# Patient Record
Sex: Female | Born: 1995 | Race: White | Hispanic: No | Marital: Single | Smoking: Current every day smoker
Health system: Southern US, Community
[De-identification: ages and names within clinical notes are randomized; demographics above are authoritative.]

## PROBLEM LIST (undated history)

## (undated) DIAGNOSIS — F1110 Opioid abuse, uncomplicated: Secondary | ICD-10-CM

## (undated) DIAGNOSIS — F1910 Other psychoactive substance abuse, uncomplicated: Secondary | ICD-10-CM

## (undated) DIAGNOSIS — K759 Inflammatory liver disease, unspecified: Secondary | ICD-10-CM

## (undated) DIAGNOSIS — D649 Anemia, unspecified: Secondary | ICD-10-CM

## (undated) DIAGNOSIS — R011 Cardiac murmur, unspecified: Secondary | ICD-10-CM

## (undated) HISTORY — DX: Inflammatory liver disease, unspecified: K75.9

## (undated) HISTORY — DX: Cardiac murmur, unspecified: R01.1

## (undated) HISTORY — DX: Anemia, unspecified: D64.9

## (undated) HISTORY — PX: NO PAST SURGERIES: SHX2092

## (undated) HISTORY — DX: Opioid abuse, uncomplicated: F11.10

---
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
## 2017-05-06 ENCOUNTER — Emergency Department
Admission: EM | Admit: 2017-05-06 | Discharge: 2017-05-06 | Disposition: A | Discharge status: Home / Self Care | Payer: 59 | Attending: Emergency Medicine | Admitting: Emergency Medicine

## 2017-05-06 DIAGNOSIS — F17210 Nicotine dependence, cigarettes, uncomplicated: Secondary | ICD-10-CM | POA: Insufficient documentation

## 2017-05-06 DIAGNOSIS — Z79899 Other long term (current) drug therapy: Secondary | ICD-10-CM | POA: Insufficient documentation

## 2017-05-06 DIAGNOSIS — F1510 Other stimulant abuse, uncomplicated: Secondary | ICD-10-CM

## 2017-05-06 DIAGNOSIS — F1120 Opioid dependence, uncomplicated: Secondary | ICD-10-CM | POA: Insufficient documentation

## 2017-05-06 DIAGNOSIS — F1123 Opioid dependence with withdrawal: Secondary | ICD-10-CM

## 2017-05-06 DIAGNOSIS — R451 Restlessness and agitation: Secondary | ICD-10-CM | POA: Diagnosis present

## 2017-05-06 DIAGNOSIS — F1410 Cocaine abuse, uncomplicated: Secondary | ICD-10-CM

## 2017-05-06 HISTORY — DX: Other psychoactive substance abuse, uncomplicated: F19.10

## 2017-05-06 LAB — CBC
HCT: 38.7 % (ref 35.0–47.0)
HEMOGLOBIN: 13.2 g/dL (ref 12.0–16.0)
MCH: 28 pg (ref 26.0–34.0)
MCHC: 34.1 g/dL (ref 32.0–36.0)
MCV: 82.2 fL (ref 80.0–100.0)
Platelets: 353 10*3/uL (ref 150–440)
RBC: 4.7 MIL/uL (ref 3.80–5.20)
RDW: 14.7 % — ABNORMAL HIGH (ref 11.5–14.5)
WBC: 6.9 10*3/uL (ref 3.6–11.0)

## 2017-05-06 LAB — COMPREHENSIVE METABOLIC PANEL
ALK PHOS: 64 U/L (ref 38–126)
ALT: 16 U/L (ref 14–54)
AST: 19 U/L (ref 15–41)
Albumin: 4.1 g/dL (ref 3.5–5.0)
Anion gap: 8 (ref 5–15)
BUN: 11 mg/dL (ref 6–20)
CALCIUM: 9.1 mg/dL (ref 8.9–10.3)
CO2: 25 mmol/L (ref 22–32)
CREATININE: 0.65 mg/dL (ref 0.44–1.00)
Chloride: 106 mmol/L (ref 101–111)
Glucose, Bld: 121 mg/dL — ABNORMAL HIGH (ref 65–99)
Potassium: 3.9 mmol/L (ref 3.5–5.1)
Sodium: 139 mmol/L (ref 135–145)
Total Bilirubin: 0.5 mg/dL (ref 0.3–1.2)
Total Protein: 7 g/dL (ref 6.5–8.1)

## 2017-05-06 LAB — ACETAMINOPHEN LEVEL: Acetaminophen (Tylenol), Serum: <10 ug/mL — ABNORMAL LOW (ref 10–30)

## 2017-05-06 LAB — SALICYLATE LEVEL: Salicylate Lvl: <7 mg/dL (ref 2.8–30.0)

## 2017-05-06 LAB — ETHANOL

## 2017-05-06 NOTE — ED Notes (Signed)
Pt. States mother will pick her up in waiting room.

## 2017-05-06 NOTE — ED Notes (Signed)
Mother would like a call from the psychiatrist when he consults with this pt so he may receive collateral information Kerry DoryKatanya Jones - mother  [phone]

## 2017-05-06 NOTE — ED Notes (Signed)
BEHAVIORAL HEALTH ROUNDING Patient sleeping: Yes.   Patient alert and oriented: eyes closed  Appears to be asleep Behavior appropriate: Yes.  ; If no, describe:  Nutrition and fluids offered: Yes  Toileting and hygiene offered: sleeping Sitter present: q 15 minute observations and security monitoring Law enforcement present: yes  ODS 

## 2017-05-06 NOTE — ED Notes (Signed)

## 2017-05-06 NOTE — ED Notes (Signed)
Pt up using the restroom with no assistance

## 2017-05-06 NOTE — ED Notes (Signed)
Dr Renaldo Fiddlerlapecs at bedside.

## 2017-05-06 NOTE — Consult Note (Signed)
[facility] Consult   Reason for Consult:  Consult for 21 year old woman brought to the hospital voluntarily by her family because of concerns about drug abuse Referring Physician:  Eual Fines Patient Identification: ANHTHU PERDEW MRN:  [id number] Principal Diagnosis: Opiate dependence ([place]) Diagnosis:   Patient Active Problem List   Diagnosis Date Noted  . Opiate dependence ([place]) [F11.20] 05/06/2017  . 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 Cocaine abuse [F14.10] 05/06/2017    Total Time spent with patient: 1 hour  Subjective:   Leslie Klein is a 21 y.o. female patient admitted with "my mom brought me here. I'm having withdrawal".  HPI:  21 year old woman brought here voluntarily by her family. Patient states she is currently having symptoms of heroin withdrawal. She is complaining of being sick to her stomach, having aches and pains all over her body, sweating, feeling jittery. She last had any heroin yesterday midday. Patient was locked up in jail for what she describes as a few hours before her mother bailed her out. On getting home she and her mother had an argument and her mother took her phone away from her. Patient admits that she made a threat to kill herself because her mother took her phone away from her but says very clearly that this was only to get the phone back. She denies any intention plan or wish to kill her self. Patient says other than these particular events her mood has been fine. She admits that she sleeps poorly and has been eating poorly and losing significant weight. She says she is using heroin pretty much every day and also using methamphetamine and crack cocaine frequently. She is not engaging in any kind of mental health or substance abuse treatment. Patient denies hallucinations or psychotic symptoms. Denies any thoughts to harm anyone else. Not currently on any prescription medicine.  Social history: She reports that she is married but her husband  is locked up in jail. He is also a drug abuser. For now she is staying with her mother. Patient just got out after being locked up for a brief period of time.  Medical history: Patient describes symptoms characteristic of opiate withdrawal. Otherwise has no known diagnosed medical issues.  Substance abuse history: She says she's been abusing drugs for a couple years now. She has been to [facility] one time for about a week but did not stay clean at all after going in there. Has never really engaged in outpatient substance abuse treatment. Has never gone all the way through narcotic withdrawal to get clean before. Denies ever having overdosed from opiates. Drinking alcohol.  Past Psychiatric History: As noted above she is been to [facility] for drug abuse but denies ever seeing a psychiatrist for any other kind of mental health condition. No history of suicide attempts reported. No history of violence or psychosis. Denies ever being on any psychiatric medicine.  Risk to Self: Is patient at risk for suicide?: No Risk to Others:   Prior Inpatient Therapy:   Prior Outpatient Therapy:    Past Medical History:  Past Medical History:  Diagnosis Date  . Substance abuse    No past surgical history on file. Family History: No family history on file. Family Psychiatric  History: Does not know of any Social History:  History  Alcohol Use No     History  Drug Use    Comment: past per pt    Social History   Social History  . Marital status: Single  Spouse name: N/A  . Number of children: N/A  . Years of education: N/A   Social History Main Topics  . Smoking status: Current Every Day Smoker    Packs/day: 0.50    Types: Cigarettes  . Smokeless tobacco: Never Used  . Alcohol use No  . Drug use: Yes     Comment: past per pt  . Sexual activity: Not on file   Other Topics Concern  . Not on file   Social History Narrative  . No narrative on file   Additional Social History:      Allergies:   Allergies  Allergen Reactions  . Amoxicillin Rash    Labs:  Results for orders placed or performed during the hospital encounter of 05/06/17 (from the past 48 hour(s))  Comprehensive metabolic panel     Status: Abnormal   Collection Time: 05/06/17  3:47 PM  Result Value Ref Range   Sodium 139 135 - 145 mmol/L   Potassium 3.9 3.5 - 5.1 mmol/L   Chloride 106 101 - 111 mmol/L   CO2 25 22 - 32 mmol/L   Glucose, Bld 121 (H) 65 - 99 mg/dL   BUN 11 6 - 20 mg/dL   Creatinine, Ser 0.65 0.44 - 1.00 mg/dL   Calcium 9.1 8.9 - 10.3 mg/dL   Total Protein 7.0 6.5 - 8.1 g/dL   Albumin 4.1 3.5 - 5.0 g/dL   AST 19 15 - 41 U/L   ALT 16 14 - 54 U/L   Alkaline Phosphatase 64 38 - 126 U/L   Total Bilirubin 0.5 0.3 - 1.2 mg/dL   GFR calc non Af Amer >60 >60 mL/min   GFR calc Af Amer >60 >60 mL/min    Comment: (NOTE) The eGFR has been calculated using the CKD EPI equation. This calculation has not been validated in all clinical situations. eGFR's persistently <60 mL/min signify possible Chronic Kidney Disease.    Anion gap 8 5 - 15  Ethanol     Status: None   Collection Time: 05/06/17  3:47 PM  Result Value Ref Range   Alcohol, Ethyl (B) <5 <5 mg/dL    Comment:        LOWEST DETECTABLE LIMIT FOR SERUM ALCOHOL IS 5 mg/dL FOR MEDICAL PURPOSES ONLY   Salicylate level     Status: None   Collection Time: 05/06/17  3:47 PM  Result Value Ref Range   Salicylate Lvl <3.4 2.8 - 30.0 mg/dL  Acetaminophen level     Status: Abnormal   Collection Time: 05/06/17  3:47 PM  Result Value Ref Range   Acetaminophen (Tylenol), Serum <10 (L) 10 - 30 ug/mL    Comment:        THERAPEUTIC CONCENTRATIONS VARY SIGNIFICANTLY. A RANGE OF 10-30 ug/mL MAY BE AN EFFECTIVE CONCENTRATION FOR MANY PATIENTS. HOWEVER, SOME ARE BEST TREATED AT CONCENTRATIONS OUTSIDE THIS RANGE. ACETAMINOPHEN CONCENTRATIONS >150 ug/mL AT 4 HOURS AFTER INGESTION AND >50 ug/mL AT 12 HOURS AFTER INGESTION ARE OFTEN  ASSOCIATED WITH TOXIC REACTIONS.   cbc     Status: Abnormal   Collection Time: 05/06/17  3:47 PM  Result Value Ref Range   WBC 6.9 3.6 - 11.0 K/uL   RBC 4.70 3.80 - 5.20 MIL/uL   Hemoglobin 13.2 12.0 - 16.0 g/dL   HCT 38.7 35.0 - 47.0 %   MCV 82.2 80.0 - 100.0 fL   MCH 28.0 26.0 - 34.0 pg   MCHC 34.1 32.0 - 36.0 g/dL   RDW 14.7 (H)  11.5 - 14.5 %   Platelets 353 150 - 440 K/uL    No current facility-administered medications for this encounter.    Current Outpatient Prescriptions  Medication Sig Dispense Refill  . acetaminophen (TYLENOL) 500 MG tablet Take 500 mg by mouth every 6 (six) hours as needed for mild pain or headache.    . cephALEXin (KEFLEX) 500 MG capsule Take 1 capsule (500 mg total) by mouth 4 (four) times daily. 28 capsule 0  . HYDROcodone-acetaminophen (NORCO/VICODIN) 5-325 MG tablet Take 1 tablet every 6 hours for pain not helped by Tylenol alone 6 tablet 0  . ibuprofen (ADVIL,MOTRIN) 200 MG tablet Take 200 mg by mouth every 6 (six) hours as needed for moderate pain.    Marland Kitchen ondansetron (ZOFRAN ODT) 8 MG disintegrating tablet Take 1 tablet (8 mg total) by mouth every 8 (eight) hours as needed for vomiting. 8 tablet 0  . ondansetron (ZOFRAN) 4 MG tablet Take 1 tablet (4 mg total) by mouth every 6 (six) hours. 12 tablet 0  . oxymetazoline (AFRIN) 0.05 % nasal spray Place 1 spray into both nostrils 2 (two) times daily.    . sodium chloride (OCEAN) 0.65 % SOLN nasal spray Place 1 spray into both nostrils as needed for congestion.      Musculoskeletal: Strength & Muscle Tone: decreased Gait & Station: normal Patient leans: N/A  Psychiatric Specialty Exam: Physical Exam  Constitutional: She appears well-developed. She appears cachectic.  HENT:  Head: Normocephalic and atraumatic.  Eyes: Pupils are equal, round, and reactive to light. Conjunctivae are normal.  Neck: Normal range of motion.  Cardiovascular: Normal heart sounds.   Respiratory: Effort normal.  GI: Soft.   Musculoskeletal: Normal range of motion.  Neurological: She is alert.  Skin: Skin is warm. She is diaphoretic.     Psychiatric: Her affect is blunt. Her speech is delayed. She is slowed. Thought content is not paranoid and not delusional. Cognition and memory are normal. She expresses impulsivity. She expresses no homicidal and no suicidal ideation.    Review of Systems  Constitutional: Positive for diaphoresis, malaise/fatigue and weight loss.  HENT: Negative.   Eyes: Negative.   Respiratory: Negative.   Cardiovascular: Negative.   Gastrointestinal: Positive for nausea.  Musculoskeletal: Positive for myalgias.  Skin: Negative.   Neurological: Negative.   Psychiatric/Behavioral: Positive for substance abuse. Negative for depression, hallucinations, memory loss and suicidal ideas. The patient has insomnia. The patient is not nervous/anxious.     Blood pressure (!) 104/91, pulse 72, temperature 98.1 F (36.7 C), temperature source Oral, resp. rate 18, height 5' 3"  (1.6 m), weight 45.4 kg (100 lb), SpO2 98 %.Body mass index is 17.71 kg/m.  General Appearance: Disheveled  Eye Contact:  Fair  Speech:  Slow  Volume:  Decreased  Mood:  Dysphoric  Affect:  Constricted  Thought Process:  Goal Directed  Orientation:  Full (Time, Place, and Person)  Thought Content:  Logical  Suicidal Thoughts:  No  Homicidal Thoughts:  No  Memory:  Immediate;   Good Recent;   Fair Remote;   Fair  Judgement:  Fair  Insight:  Fair  Psychomotor Activity:  Decreased  Concentration:  Concentration: Fair  Recall:  [employer] of Knowledge:  Fair  Language:  Fair  Akathisia:  No  Handed:  Right  AIMS (if indicated):     Assets:  Social Support  ADL's:  Intact  Cognition:  Impaired,  Mild  Sleep:        Treatment  Plan Summary: Plan This is a 21 year old woman with heroin addiction also abusing other drugs. Clearly underweight and covered with sores on her skin.Marland Kitchen Poor self-care. However the  patient completely denies any suicidal or homicidal thought or intent. No evidence that she was actually intending to harm or kill herself. Denies any past suicide attempts. Patient accurately describes narcotic withdrawal. She does not meet commitment criteria for any other mental health condition. I reviewed with the patient the potential options. If she still had appropriate insurance we could look into whether [facility] was available. We could also look into whether residential treatment services was available. Patient declines both of these offers saying that she really does not want to stop using right now. Her reason is because she does not want to go through all of the withdrawal. At this point patient does not meet criteria for admission to the psychiatric hospital. I spoke with her at some length about the dangers of drug abuse including the multiple obvious fatal outcomes that can occur from abusing drugs the way she is doing so. Strongly encouraged her to get involved in withdrawal and sobriety. Case reviewed with emergency room physician and TTS. Patient can be released from the emergency room at the discretion of the emergency room doctor.  Disposition: Patient does not meet criteria for psychiatric inpatient admission. Supportive therapy provided about ongoing stressors.  Alethia Berthold, MD 05/06/2017 7:25 PM

## 2017-05-06 NOTE — ED Provider Notes (Signed)
[facility] Emergency Department Provider Note  ____________________________________________  Time seen: Approximately 4:23 PM  I have reviewed the triage vital signs and the nursing notes.   HISTORY  Chief Complaint Withdrawal and Agitation   HPI Leslie Klein is a 21 y.o. female history of heroin abuse [place]. Patient was released from jail yesterday. Today she was using heroin and told her mother. The both of them got into an argument and her mother took her phone. Patient told her mother that she was going to hurt herself in order to get her phone back. Patient denies any suicidality at this time. She denies any prior suicide attempts. She denies homicidal ideation. She declined any help with her substance abuse. She has no medical complaints at this time. She has been using heroin several years on a regular basis.  Past Medical History:  Diagnosis Date  . Substance abuse     There are no active problems to display for this patient.   No past surgical history on file.  Prior to Admission medications   Medication Sig Start Date End Date Taking? Authorizing Provider  acetaminophen (TYLENOL) 500 MG tablet Take 500 mg by mouth every 6 (six) hours as needed for mild pain or headache.    [provider]  cephALEXin (KEFLEX) 500 MG capsule Take 1 capsule (500 mg total) by mouth 4 (four) times daily. 09/23/16   Bethann BerkshireZammit, Joseph, MD  HYDROcodone-acetaminophen (NORCO/VICODIN) 5-325 MG tablet Take 1 tablet every 6 hours for pain not helped by Tylenol alone 09/23/16   Bethann BerkshireZammit, Joseph, MD  ibuprofen (ADVIL,MOTRIN) 200 MG tablet Take 200 mg by mouth every 6 (six) hours as needed for moderate pain.    [provider]  ondansetron (ZOFRAN ODT) 8 MG disintegrating tablet Take 1 tablet (8 mg total) by mouth every 8 (eight) hours as needed for vomiting. 09/29/15   Zadie RhineWickline, Donald, MD    ondansetron (ZOFRAN) 4 MG tablet Take 1 tablet (4 mg total) by mouth every 6 (six) hours. 09/23/16   Bethann BerkshireZammit, Joseph, MD  oxymetazoline (AFRIN) 0.05 % nasal spray Place 1 spray into both nostrils 2 (two) times daily.    [provider]  sodium chloride (OCEAN) 0.65 % SOLN nasal spray Place 1 spray into both nostrils as needed for congestion.    [provider]    Allergies Amoxicillin  No family history on file.  Social History Social History  Substance Use Topics  . Smoking status: Current Every Day Smoker    Packs/day: 0.50    Types: Cigarettes  . Smokeless tobacco: Never Used  . Alcohol use No    Review of Systems  Constitutional: Negative for fever. Eyes: Negative for visual changes. ENT: Negative for sore throat. Neck: No neck pain  Cardiovascular: Negative for chest pain. Respiratory: Negative for shortness of breath. Gastrointestinal: Negative for abdominal pain, vomiting or diarrhea. Genitourinary: Negative for dysuria. Musculoskeletal: Negative for back pain. Skin: Negative for rash. Neurological: Negative for headaches, weakness or numbness. Psych: No SI or HI  ____________________________________________   PHYSICAL EXAM:  VITAL SIGNS: ED Triage Vitals  Enc Vitals Group     BP 05/06/17 1546 (!) 104/91     Pulse Rate 05/06/17 1546 72     Resp 05/06/17 1546 18     Temp 05/06/17 1546 98.1 F (36.7 C)     Temp Source 05/06/17 1546 Oral     SpO2 05/06/17  1546 98 %     Weight 05/06/17 1546 100 lb (45.4 kg)     Height 05/06/17 1546  (1.6 m)     Head Circumference --      Peak Flow --      Pain Score 05/06/17 1545 7     Pain Loc --      Pain Edu? --      Excl. in GC? --     Constitutional: Alert and oriented. Well appearing and in no apparent distress. HEENT:      Head: Normocephalic and atraumatic.         Eyes: Conjunctivae are normal. Sclera is non-icteric.       Mouth/Throat: Mucous membranes are moist.       Neck: Supple  with no signs of meningismus. Cardiovascular: Regular rate and rhythm. No murmurs, gallops, or rubs. 2+ symmetrical distal pulses are present in all extremities. No JVD. Respiratory: Normal respiratory effort. Lungs are clear to auscultation bilaterally. No wheezes, crackles, or rhonchi.  Gastrointestinal: Soft, non tender, and non distended with positive bowel sounds. No rebound or guarding. Genitourinary: No CVA tenderness. Musculoskeletal: Nontender with normal range of motion in all extremities. No edema, cyanosis, or erythema of extremities. Neurologic: Normal speech and language. Face is symmetric. Moving all extremities. No gross focal neurologic deficits are appreciated. Skin: Skin is warm, dry and intact. No rash noted. Psychiatric: Mood and affect are normal. Speech and behavior are normal.  ____________________________________________   LABS (all labs ordered are listed, but only abnormal results are displayed)  Labs Reviewed  COMPREHENSIVE METABOLIC PANEL - Abnormal; Notable for the following:       Result Value   Glucose, Bld 121 (*)    All other components within normal limits  ACETAMINOPHEN LEVEL - Abnormal; Notable for the following:    Acetaminophen (Tylenol), Serum <10 (*)    All other components within normal limits  CBC - Abnormal; Notable for the following:    RDW 14.7 (*)    All other components within normal limits  ETHANOL  SALICYLATE LEVEL  URINE DRUG SCREEN, QUALITATIVE ([facility] ONLY)   ____________________________________________  EKG  none  ____________________________________________  RADIOLOGY  none  ____________________________________________   PROCEDURES  Procedure(s) performed: None Procedures Critical Care performed:  None ____________________________________________   INITIAL IMPRESSION / ASSESSMENT AND PLAN / ED COURSE  21 y.o. female history of heroin abuse who is brought in by [employer] having an argument with her  mother. Patient not IVC. She denies SI or HI. Patient here voluntarily. Does not wish help with substance abuse. Labs and urine/Upreg pending for medical clearance. Will consullt psych    _________________________ 7:15 PM on 05/06/2017 -----------------------------------------  Patient seen by psychiatry and clear to be discharged.  Pertinent labs & imaging results that were available during my care of the patient were reviewed by me and considered in my medical decision making (see chart for details).    ____________________________________________   FINAL CLINICAL IMPRESSION(S) / ED DIAGNOSES  Final diagnoses:  Heroin addiction (HCC)      NEW MEDICATIONS STARTED DURING THIS VISIT:  New Prescriptions   No medications on file     Note:  This document was prepared using Dragon voice recognition software and may include unintentional dictation errors.    Nita Sickle, MD 05/06/17 [id number]

## 2017-05-06 NOTE — ED Triage Notes (Signed)
[facility]  Report to me was that she was released from jail this am and at some point used heroin - she then went to her mothers and they got into an altercation r/t the mother not giving her her phone back  Pt denies SI/HI during triage  She denies wanting any help with her substance abuse  Reports that she uses heroin, meth and cocaine

## 2017-09-28 ENCOUNTER — Encounter (HOSPITAL_COMMUNITY): Payer: Self-pay | Admitting: Emergency Medicine

## 2017-09-28 ENCOUNTER — Emergency Department (HOSPITAL_COMMUNITY)
Admission: EM | Admit: 2017-09-28 | Discharge: 2017-09-28 | Disposition: A | Discharge status: Home / Self Care | Payer: Medicaid Other | Attending: Emergency Medicine | Admitting: Emergency Medicine

## 2017-09-28 DIAGNOSIS — R1110 Vomiting, unspecified: Secondary | ICD-10-CM | POA: Diagnosis not present

## 2017-09-28 DIAGNOSIS — R51 Headache: Secondary | ICD-10-CM | POA: Insufficient documentation

## 2017-09-28 DIAGNOSIS — R509 Fever, unspecified: Secondary | ICD-10-CM | POA: Insufficient documentation

## 2017-09-28 DIAGNOSIS — F1910 Other psychoactive substance abuse, uncomplicated: Secondary | ICD-10-CM | POA: Diagnosis not present

## 2017-09-28 DIAGNOSIS — R42 Dizziness and giddiness: Secondary | ICD-10-CM | POA: Insufficient documentation

## 2017-09-28 LAB — RAPID URINE DRUG SCREEN, HOSP PERFORMED
AMPHETAMINES: POSITIVE — AB
Barbiturates: NOT DETECTED
Benzodiazepines: POSITIVE — AB
Cocaine: NOT DETECTED
OPIATES: POSITIVE — AB
Tetrahydrocannabinol: NOT DETECTED

## 2017-09-28 LAB — URINALYSIS, ROUTINE W REFLEX MICROSCOPIC
BILIRUBIN URINE: NEGATIVE
GLUCOSE, UA: NEGATIVE mg/dL
HGB URINE DIPSTICK: NEGATIVE
KETONES UR: NEGATIVE mg/dL
Leukocytes, UA: NEGATIVE
Nitrite: NEGATIVE
PROTEIN: NEGATIVE mg/dL
Specific Gravity, Urine: 1.021 (ref 1.005–1.030)
pH: 5 (ref 5.0–8.0)

## 2017-09-28 LAB — INFLUENZA PANEL BY PCR (TYPE A & B)
INFLAPCR: NEGATIVE
INFLBPCR: NEGATIVE

## 2017-09-28 MED ORDER — SODIUM CHLORIDE 0.9 % IV SOLN
1000.0000 mL | INTRAVENOUS | Status: DC
  Administered 2017-09-28: 1000 mL via INTRAVENOUS

## 2017-09-28 MED ORDER — SODIUM CHLORIDE 0.9 % IV BOLUS (SEPSIS)
1000.0000 mL | Freq: Once | INTRAVENOUS | Status: AC
  Administered 2017-09-28: 1000 mL via INTRAVENOUS

## 2017-09-28 NOTE — Discharge Instructions (Signed)
Your vital signs have improved since being in the emergency department and receiving IV fluids.  Please discuss your symptoms with your Suboxone provider.  Please use your Suboxone as prescribed.  Your influenza test is negative.  Your urine analysis is negative for infection.  Please return to the emergency department immediately if any emergent changes, problems, or concerns.

## 2017-09-28 NOTE — ED Provider Notes (Signed)
[facility] EMERGENCY DEPARTMENT Provider Note   CSN: [id number] Arrival date & time: 09/28/17  1831     History   Chief Complaint Chief Complaint  Patient presents with  . Fever    HPI Leslie Klein is a 22 y.o. female.  Patient is a 22 year old female who presents to the emergency room with complaint of fever and headache.  The patient states her symptoms have been going on for 1 or 2 days.  On last night the symptoms seem to be worse.  She states that she noticed fever.  She is not sure exactly how high it was, but felt flushed and had chills.  She says she has had a headache that is getting progressively worse.  The patient also speaks of having generalized body aches.  She has had one episode of vomiting, and also complains of mild dizziness particularly with change of position.  The patient states she has been around others who have been sick with upper respiratory type infections.  She has not been out of the country recently.  She has not had changes in her medications recently.  She presents to the emergency department now for assistance with this issue.   The history is provided by the patient.    Past Medical History:  Diagnosis Date  . Substance abuse [facility](HCC)     Patient Active Problem List   Diagnosis Date Noted  . Opiate dependence (HCC) 05/06/2017  . 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 Cocaine abuse (HCC) 05/06/2017    History reviewed. No pertinent surgical history.  OB History    Gravida Para Term Preterm AB Living   1             SAB TAB Ectopic Multiple Live Births                   Home Medications    Prior to Admission medications   Medication Sig Start Date End Date Taking? Authorizing Provider  acetaminophen (TYLENOL) 500 MG tablet Take 500 mg by mouth every 6 (six) hours as needed for mild pain or headache.    [provider]  cephALEXin (KEFLEX) 500 MG capsule Take 1 capsule (500 mg total) by mouth 4 (four) times daily. 09/23/16    Bethann BerkshireZammit, Joseph, MD  HYDROcodone-acetaminophen (NORCO/VICODIN) 5-325 MG tablet Take 1 tablet every 6 hours for pain not helped by Tylenol alone 09/23/16   Bethann BerkshireZammit, Joseph, MD  ibuprofen (ADVIL,MOTRIN) 200 MG tablet Take 200 mg by mouth every 6 (six) hours as needed for moderate pain.    [provider]  ondansetron (ZOFRAN ODT) 8 MG disintegrating tablet Take 1 tablet (8 mg total) by mouth every 8 (eight) hours as needed for vomiting. 09/29/15   Zadie RhineWickline, Donald, MD  ondansetron (ZOFRAN) 4 MG tablet Take 1 tablet (4 mg total) by mouth every 6 (six) hours. 09/23/16   Bethann BerkshireZammit, Joseph, MD  oxymetazoline (AFRIN) 0.05 % nasal spray Place 1 spray into both nostrils 2 (two) times daily.    [provider]  sodium chloride (OCEAN) 0.65 % SOLN nasal spray Place 1 spray into both nostrils as needed for congestion.    [provider]    Family History No family history on file.  Social History Social History   Tobacco Use  . Smoking status: Current Every Day Smoker    Packs/day: 0.50    Types: Cigarettes  . Smokeless tobacco: Never Used  Substance Use Topics  . Alcohol use: No  .  Drug use: Yes    Comment: past per pt     Allergies   Amoxicillin   Review of Systems Review of Systems  Constitutional: Positive for chills, fatigue and fever. Negative for activity change.       All ROS Neg except as noted in HPI  HENT: Positive for congestion. Negative for nosebleeds.   Eyes: Negative for photophobia and discharge.  Respiratory: Positive for cough. Negative for shortness of breath and wheezing.   Cardiovascular: Negative for chest pain and palpitations.  Gastrointestinal: Negative for abdominal pain and blood in stool.  Genitourinary: Negative for dysuria, frequency and hematuria.  Musculoskeletal: Positive for myalgias. Negative for arthralgias, back pain and neck pain.  Skin: Negative.   Neurological: Positive for dizziness. Negative for seizures and speech  difficulty.  Psychiatric/Behavioral: Negative for confusion and hallucinations.     Physical Exam Updated Vital Signs BP 127/79 (BP Location: Right Arm)   Pulse (!) 116   Temp 97.7 F (36.5 C) (Oral)   Resp 18   Ht 5\' 3"  (1.6 m)   Wt 45.4 kg (100 lb)   LMP 07/31/2017   SpO2 97%   Breastfeeding? Unknown   BMI 17.71 kg/m   Physical Exam  Constitutional: She is oriented to person, place, and time. She appears well-developed and well-nourished.  Non-toxic appearance.  Patient has a seemingly hard time keeping her head up.  Her head keeps dropping lower as I interview her and examiner.  Her eyes are closed during the majority of my interview and examination.  She seems to be slightly lethargic.  HENT:  Head: Normocephalic.  Right Ear: Tympanic membrane and external ear normal.  Left Ear: Tympanic membrane and external ear normal.  Mild nasal congestion present.  Eyes: EOM and lids are normal. Pupils are equal, round, and reactive to light.  Neck: Normal range of motion. Neck supple. Carotid bruit is not present.  Cardiovascular: Regular rhythm, normal heart sounds, intact distal pulses and normal pulses. Tachycardia present. Exam reveals no gallop and no friction rub.  No murmur heard. Pulmonary/Chest: Breath sounds normal. No respiratory distress.  Abdominal: Soft. Bowel sounds are normal. There is no tenderness. There is no guarding.  Musculoskeletal: Normal range of motion.  Lymphadenopathy:       Head (right side): No submandibular adenopathy present.       Head (left side): No submandibular adenopathy present.    She has no cervical adenopathy.  Neurological: She is alert and oriented to person, place, and time. She has normal strength. No cranial nerve deficit or sensory deficit.  Eyelids seem to be heavy.  Patient seems to be sluggish.   Skin: Skin is warm and dry.  Psychiatric: She has a normal mood and affect. Her speech is normal. She expresses no homicidal and no  suicidal ideation. She expresses no suicidal plans and no homicidal plans.  Patient has difficulty staying focused at times.  Nursing note and vitals reviewed.    ED Treatments / Results  Labs (all labs ordered are listed, but only abnormal results are displayed) Labs Reviewed  URINALYSIS, ROUTINE W REFLEX MICROSCOPIC - Abnormal; Notable for the following components:      Result Value   APPearance HAZY (*)    All other components within normal limits  RAPID URINE DRUG SCREEN, HOSP PERFORMED - Abnormal; Notable for the following components:   Opiates POSITIVE (*)    Benzodiazepines POSITIVE (*)    Amphetamines POSITIVE (*)    All other components within  normal limits  INFLUENZA PANEL BY PCR (TYPE A & B)    EKG  EKG Interpretation None       Radiology No results found.  Procedures Procedures (including critical care time)  Medications Ordered in ED Medications  sodium chloride 0.9 % bolus 1,000 mL (1,000 mLs Intravenous New Bag/Given 09/28/17 2216)    Followed by  0.9 %  sodium chloride infusion (not administered)     Initial Impression / Assessment and Plan / ED Course  I have reviewed the triage vital signs and the nursing notes.  Pertinent labs & imaging results that were available during my care of the patient were reviewed by me and considered in my medical decision making (see chart for details).       Final Clinical Impressions(s) / ED Diagnoses MDM  Patient seems to have heaviness of her eyelids.  She has some difficulty with maintaining focus at times.  She seems mild to moderately lethargic, complains that she has had problems with fever and headache.  We will move the patient to the acute unit in the emergency department.  We will start IV fluids.  Patient will be checked for urinary tract infection, influenza, and urine drug screen.  The urine drug screen is positive for opiates, benzodiazepines, and amphetamines.  Urine is negative for urinary tract  infection, influenza a and B are negative.  I confronted the patient about the positive drug test.  She states that she uses heroin with her last use being on yesterday.  She has not had any today.  She says she had a Xanax today, she has not had any Xanax for a week prior to today though.  She also use methamphetamine, and she last used that 2 days ago.  She is in a Suboxone clinic, and she takes a Suboxone every day.  She has been in the Suboxone clinic for 3 weeks.  After IV fluids and observation, the heart rate has improved.  Patient maintaining steady blood pressure.  She is now awake enough to operate her phone.  She is more talkative with her family members.  She had some drink in the emergency department was able to swallow and manage drinking fluids without problem.  Patient denies suicidal or homicidal ideations.  I have asked the patient to see the clinicians at her Suboxone clinic on tomorrow or as soon as possible.  I warned the patient of the danger of polysubstance abuse.  The patient acknowledges understanding of these instructions.  I have encouraged the patient to return to the emergency department immediately if any difficulty with breathing, difficulty with swallowing, changes, problems, or concerns in her general condition.  The patient acknowledges understanding of the instructions, as well as family.   Final diagnoses:  Polysubstance abuse [facility])    ED Discharge Orders    None       Ivery Quale, PA-C 09/28/17 2344    Samuel Jester, DO 09/29/17 2333

## 2017-09-28 NOTE — ED Triage Notes (Signed)
Pt c/o fever and HA that began last night. Pt also reports general malaise, vomiting x 1 and dizziness.

## 2017-09-28 NOTE — ED Notes (Signed)
Mother states they were planning to take her to rehab after her visit here tonight

## 2018-05-07 ENCOUNTER — Emergency Department (HOSPITAL_COMMUNITY)
Admission: EM | Admit: 2018-05-07 | Discharge: 2018-05-07 | Disposition: A | Discharge status: Home / Self Care | Payer: Medicaid Other | Attending: Emergency Medicine | Admitting: Emergency Medicine

## 2018-05-07 ENCOUNTER — Emergency Department (HOSPITAL_COMMUNITY): Payer: Medicaid Other

## 2018-05-07 ENCOUNTER — Encounter (HOSPITAL_COMMUNITY): Payer: Self-pay | Admitting: Emergency Medicine

## 2018-05-07 ENCOUNTER — Other Ambulatory Visit: Payer: Self-pay

## 2018-05-07 DIAGNOSIS — Y9301 Activity, walking, marching and hiking: Secondary | ICD-10-CM | POA: Diagnosis not present

## 2018-05-07 DIAGNOSIS — S93402A Sprain of unspecified ligament of left ankle, initial encounter: Secondary | ICD-10-CM

## 2018-05-07 DIAGNOSIS — Y999 Unspecified external cause status: Secondary | ICD-10-CM | POA: Diagnosis not present

## 2018-05-07 DIAGNOSIS — Y929 Unspecified place or not applicable: Secondary | ICD-10-CM | POA: Diagnosis not present

## 2018-05-07 DIAGNOSIS — S99912A Unspecified injury of left ankle, initial encounter: Secondary | ICD-10-CM | POA: Diagnosis present

## 2018-05-07 DIAGNOSIS — F17210 Nicotine dependence, cigarettes, uncomplicated: Secondary | ICD-10-CM | POA: Diagnosis not present

## 2018-05-07 DIAGNOSIS — X500XXA Overexertion from strenuous movement or load, initial encounter: Secondary | ICD-10-CM | POA: Diagnosis not present

## 2018-05-07 MED ORDER — IBUPROFEN 600 MG PO TABS: 600.0000 mg | ORAL_TABLET | Freq: Four times a day (QID) | ORAL | 0 refills | Status: DC | PRN

## 2018-05-07 NOTE — ED Provider Notes (Addendum)
[facility] EMERGENCY DEPARTMENT Provider Note   CSN: [id number] Arrival date & time: 05/07/18  2018     History   Chief Complaint Chief Complaint  Patient presents with  . Ankle Pain    HPI Leslie Klein is a 22 y.o. female with a 4 day history of leftankle pain which occurred suddenly when the patient twisted her ankle while walking.   Pain is aching, constant and worse with palpation, movement and weight bearing.  The patient has been able to weight bear immediately after the event.  There is no radiation of pain and the patient denies numbness distal to the injury site.  The patients treatment prior to arrival included elevation, ice and rest. .  The history is provided by the patient and a parent.    Past Medical History:  Diagnosis Date  . Substance abuse [facility])     Patient Active Problem List   Diagnosis Date Noted  . Opiate dependence (HCC) 05/06/2017  . 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 Cocaine abuse (HCC) 05/06/2017    History reviewed. No pertinent surgical history.   OB History    Gravida  1   Para      Term      Preterm      AB      Living        SAB      TAB      Ectopic      Multiple      Live Births               Home Medications    Prior to Admission medications   Medication Sig Start Date End Date Taking? Authorizing Provider  diphenhydrAMINE (BENADRYL) 25 mg capsule Take 25 mg by mouth at bedtime as needed for sleep.   Yes [provider]  ibuprofen (ADVIL,MOTRIN) 600 MG tablet Take 1 tablet (600 mg total) by mouth every 6 (six) hours as needed. 05/07/18   Burgess Amor, PA-C    Family History No family history on file.  Social History Social History   Tobacco Use  . Smoking status: Current Every Day Smoker    Packs/day: 0.50    Types: Cigarettes  . Smokeless tobacco: Never Used  Substance Use Topics  . Alcohol use: No  . Drug use: Yes    Comment: past per pt     Allergies   Amoxicillin   Review  of Systems Review of Systems  Musculoskeletal: Positive for arthralgias and joint swelling.  Skin: Negative for wound.  Neurological: Negative for weakness and numbness.     Physical Exam Updated Vital Signs BP 127/69   Pulse (!) 101   Temp 98.2 F (36.8 C) (Oral)   Resp 16   Ht 5\' 2"  (1.575 m)   Wt 54.4 kg   LMP 05/02/2018   SpO2 100%   BMI 21.95 kg/m   Physical Exam  Constitutional: She appears well-developed and well-nourished.  HENT:  Head: Normocephalic.  Cardiovascular: Normal rate and intact distal pulses. Exam reveals no decreased pulses.  Pulses:      Dorsalis pedis pulses are 2+ on the right side, and 2+ on the left side.       Posterior tibial pulses are 2+ on the right side, and 2+ on the left side.  Musculoskeletal: She exhibits edema and tenderness.       Left ankle: She exhibits decreased range of motion, swelling and ecchymosis. She exhibits no deformity, no laceration and normal  pulse. Tenderness. Lateral malleolus and head of 5th metatarsal tenderness found. No proximal fibula tenderness found. Achilles tendon normal. Achilles tendon exhibits normal Thompson's test results.  Neurological: She is alert. No sensory deficit.  Skin: Skin is warm, dry and intact.  Nursing note and vitals reviewed.    ED Treatments / Results  Labs (all labs ordered are listed, but only abnormal results are displayed) Labs Reviewed - No data to display  EKG None  Radiology Dg Ankle Complete Left  Result Date: 05/07/2018 CLINICAL DATA:  Larey Seat on Thursday twisting LEFT ankle, presents with LEFT ankle and foot swelling EXAM: LEFT ANKLE COMPLETE - 3+ VIEW COMPARISON:  None FINDINGS: Lateral soft tissue swelling. Osseous mineralization normal. Joint spaces preserved. No acute fracture, dislocation, or bone destruction. IMPRESSION: No acute osseous abnormalities. Electronically Signed   By: Ulyses Southward M.D.   On: 05/07/2018 21:08    Procedures Procedures (including critical  care time)  Medications Ordered in ED Medications - No data to display   Initial Impression / Assessment and Plan / ED Course  I have reviewed the triage vital signs and the nursing notes.  Pertinent labs & imaging results that were available during my care of the patient were reviewed by me and considered in my medical decision making (see chart for details).     ASO and crutches provided.  Cap refill normal after ASO applied.  RICE, referral to ortho if pain symptoms and swelling are not better over the next 10 days.    Final Clinical Impressions(s) / ED Diagnoses   Final diagnoses:  Sprain of left ankle, unspecified ligament, initial encounter    ED Discharge Orders         Ordered    ibuprofen (ADVIL,MOTRIN) 600 MG tablet  Every 6 hours PRN     05/07/18 2206           Burgess Amor, PA-C 05/07/18 2211    Burgess Amor, PA-C 05/07/18 2212    Benjiman Core, MD 05/07/18 2314

## 2018-05-07 NOTE — Discharge Instructions (Signed)
Wear the ASO and use crutches to avoid weight bearing.  Use ice and elevation as much as possible for the next several days to help reduce the swelling.  Take the medicine prescribed to help reduce pain and swelling.  Call the orthopedic doctor listed for a recheck of your injury in 10 days.  You may benefit from physical therapy or further evaluation of your ankle if it is not improving over this time.  Remember however that it may take a month before the sprained ligament is completely healed.

## 2018-05-07 NOTE — ED Triage Notes (Signed)
Pt fell on Thursday morning twisting her ankle. Pt presents with L. Ankle and foot swelling.

## 2019-05-17 ENCOUNTER — Other Ambulatory Visit: Payer: Self-pay | Admitting: *Deleted

## 2019-10-11 IMAGING — DX DG FOOT COMPLETE 3+V*L*
3 series · 3 of 3 positions shown · non-contrast
Comparison: Ankle radiographs earlier this day.

CLINICAL DATA: Fall with left foot and ankle pain and swelling.

EXAM:
LEFT FOOT - COMPLETE 3+ VIEW

[foot ap]
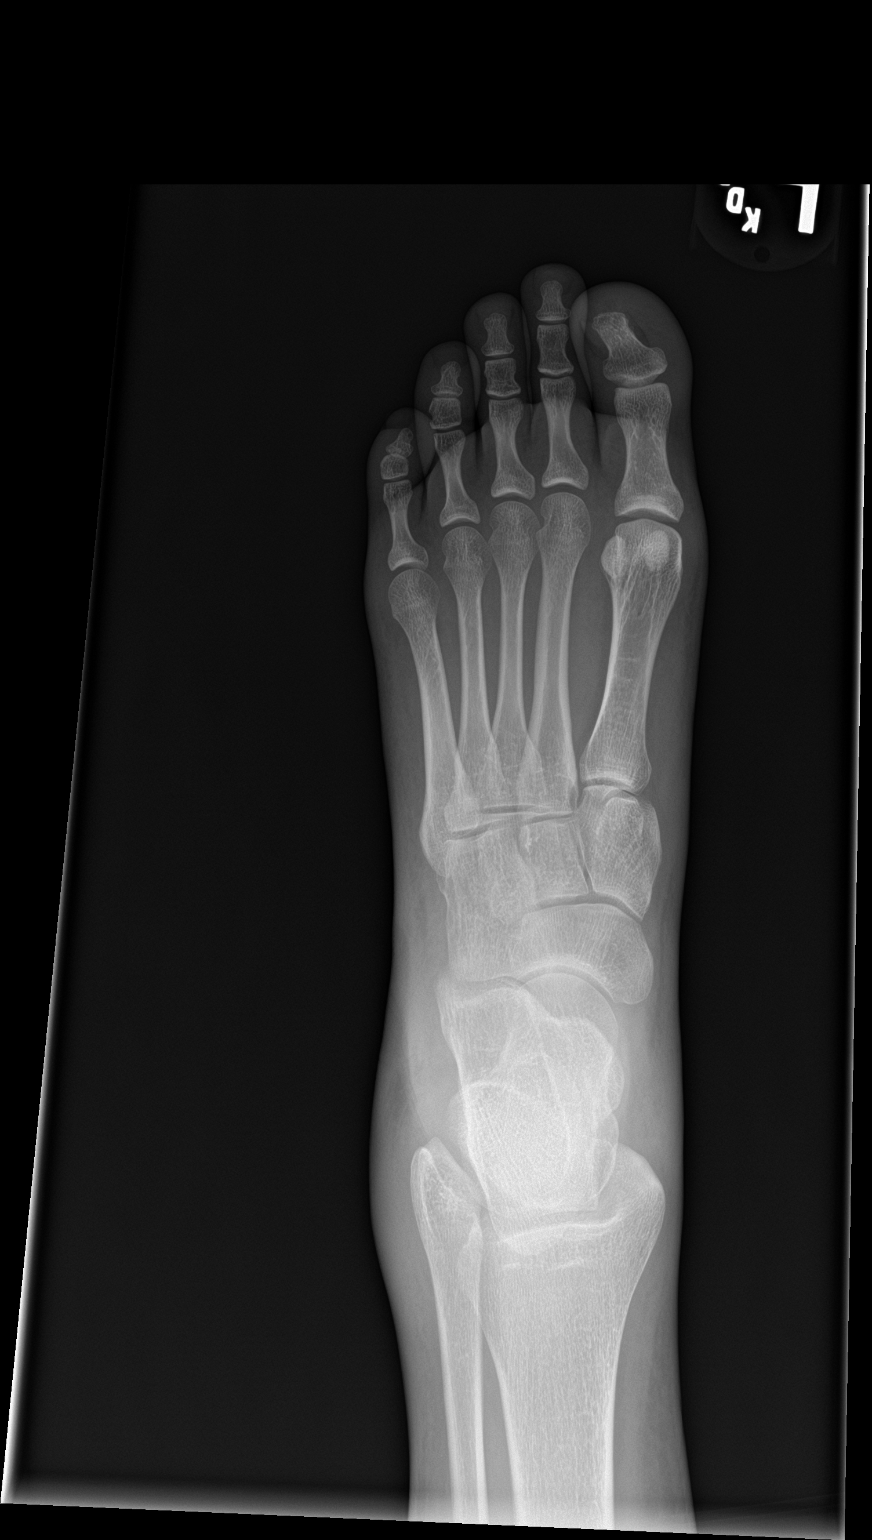

[foot obl]
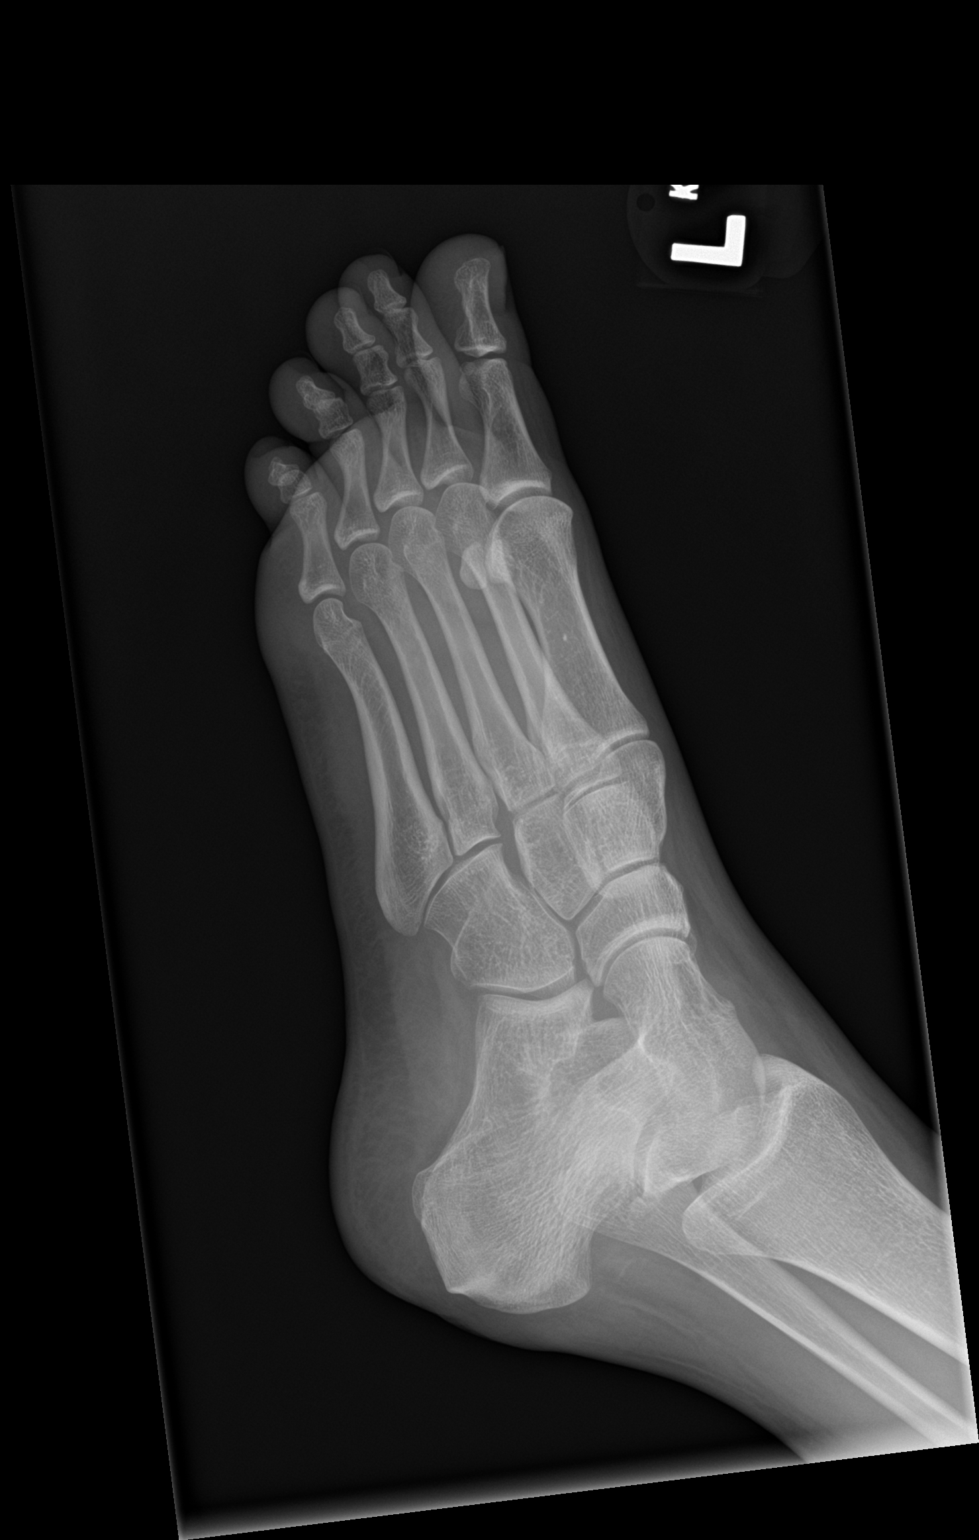

[foot lat]
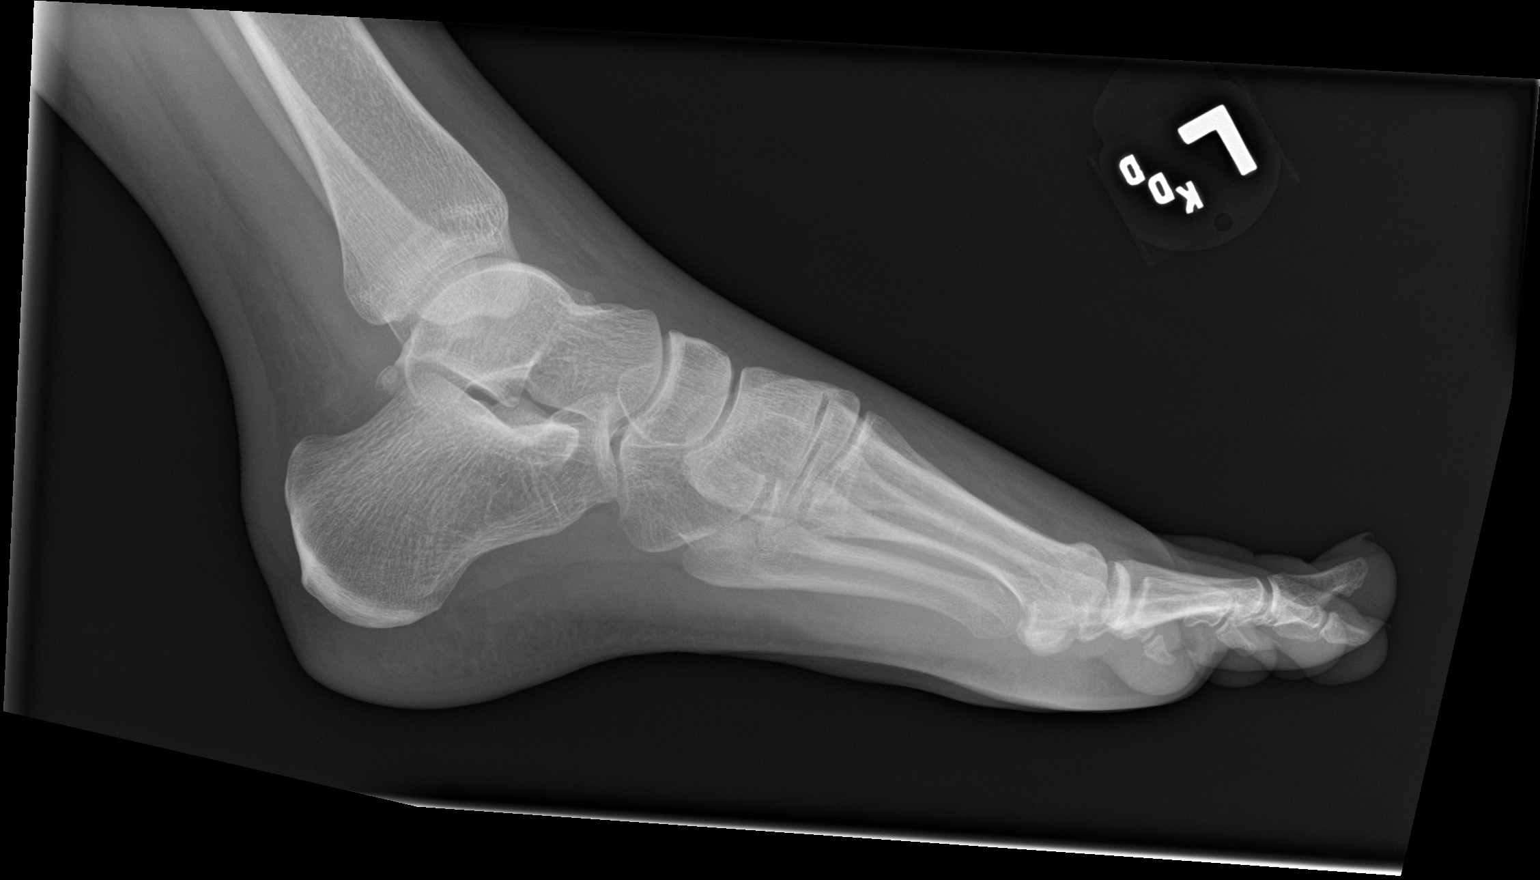

[3 of 3 positions shown; findings below may reference images not displayed]

FINDINGS: There is no evidence of fracture or dislocation. There is no
evidence of arthropathy or other focal bone abnormality. Soft tissue
edema about the ankle.
IMPRESSION: No acute osseous abnormality of the foot. Soft tissue edema about
the ankle.

## 2019-10-11 IMAGING — DX DG ANKLE COMPLETE 3+V*L*
3 series · 3 of 3 positions shown · non-contrast
Comparison: None

CLINICAL DATA: Fell on [REDACTED] twisting LEFT ankle, presents with
LEFT ankle and foot swelling

EXAM:
LEFT ANKLE COMPLETE - 3+ VIEW

[ankle ap]
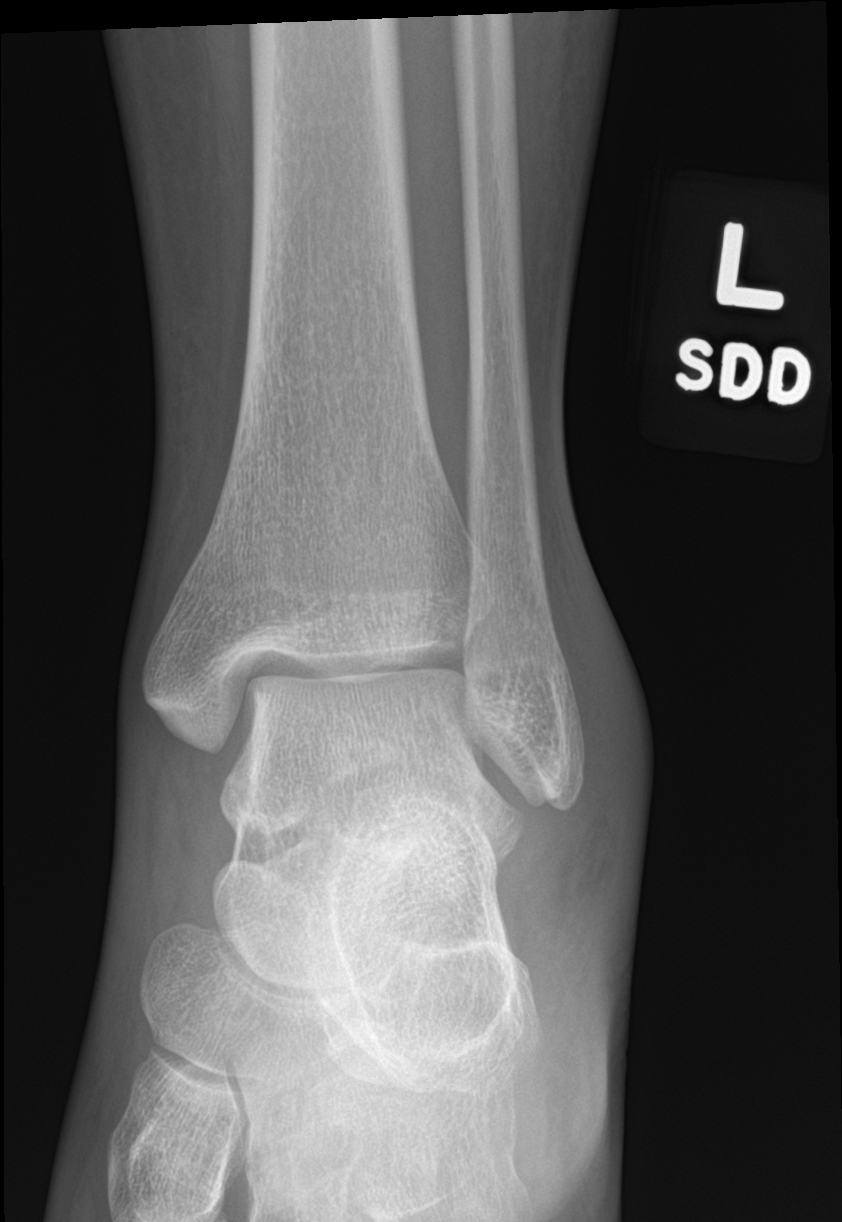

[ankle obl]
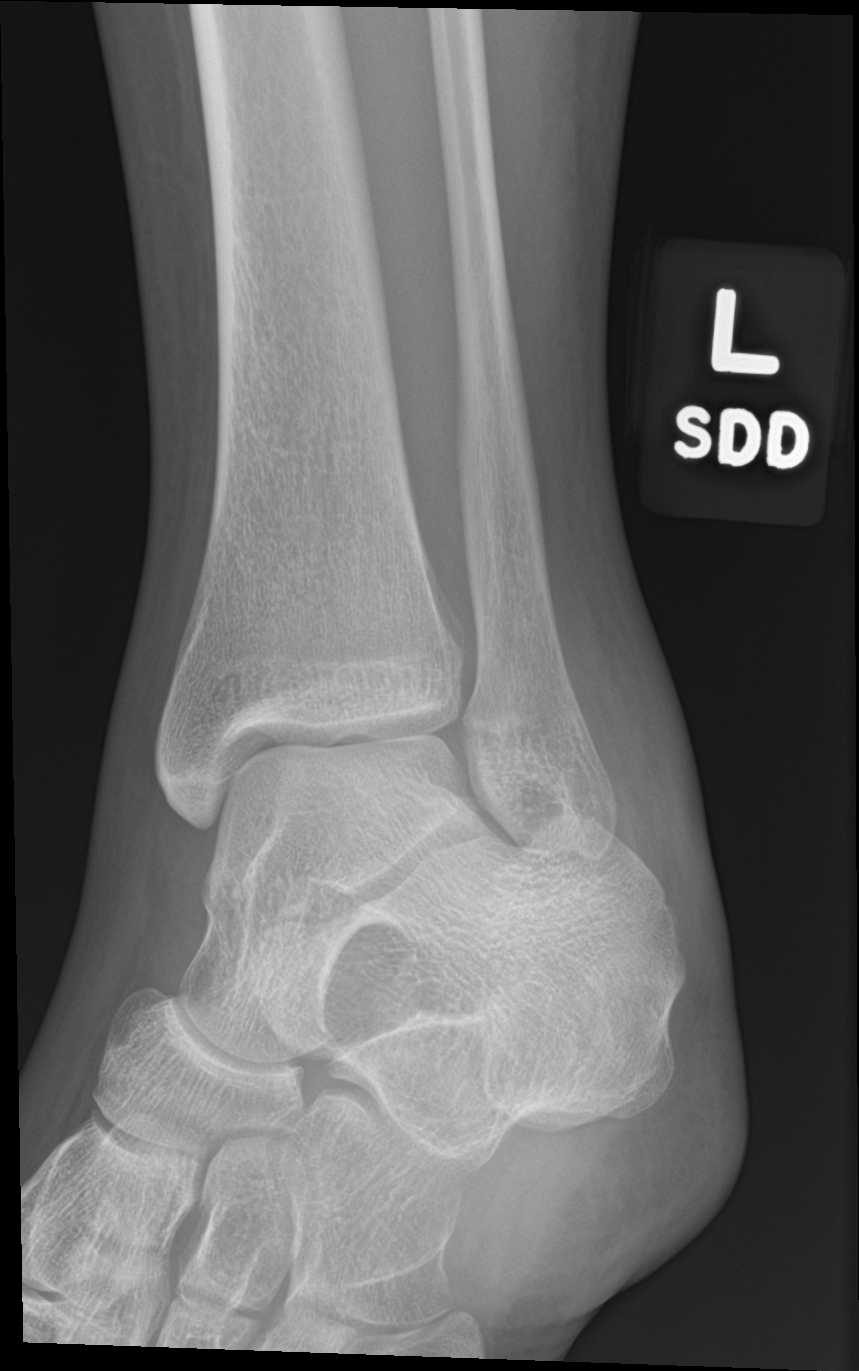

[ankle lat]
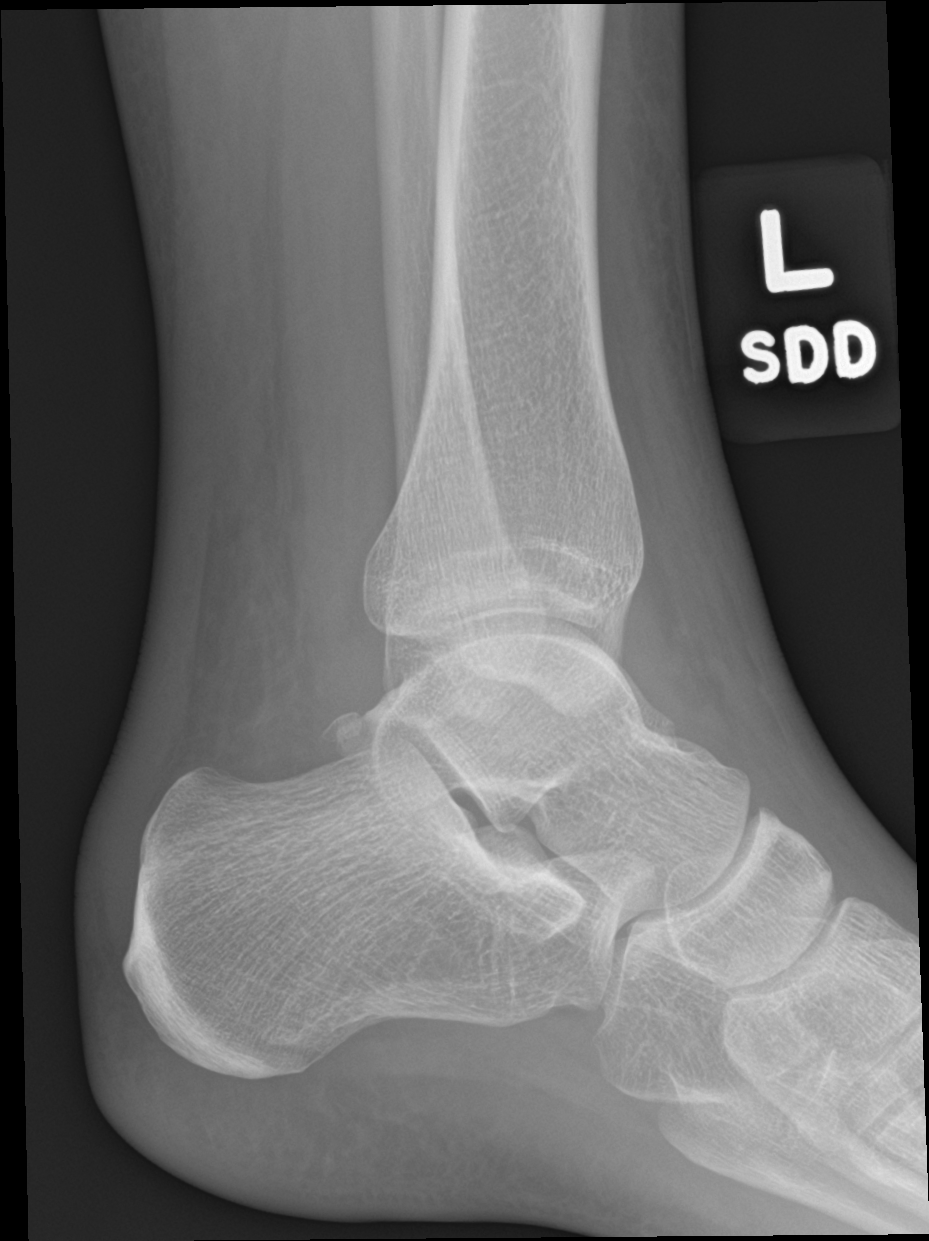

[3 of 3 positions shown; findings below may reference images not displayed]

FINDINGS: Lateral soft tissue swelling.

Osseous mineralization normal.

Joint spaces preserved.

No acute fracture, dislocation, or bone destruction.
IMPRESSION: No acute osseous abnormalities.

## 2020-11-10 LAB — OB RESULTS CONSOLE HGB/HCT, BLOOD
HCT: 38 (ref 29–41)
Hemoglobin: 12.8

## 2020-11-10 LAB — OB RESULTS CONSOLE RUBELLA ANTIBODY, IGM: Rubella: IMMUNE

## 2020-11-10 LAB — OB RESULTS CONSOLE PLATELET COUNT: Platelets: 429

## 2020-11-10 LAB — OB RESULTS CONSOLE HIV ANTIBODY (ROUTINE TESTING): HIV: NONREACTIVE

## 2020-11-10 LAB — OB RESULTS CONSOLE HEPATITIS B SURFACE ANTIGEN: Hepatitis B Surface Ag: NEGATIVE

## 2020-12-17 ENCOUNTER — Other Ambulatory Visit: Payer: Self-pay | Admitting: *Deleted

## 2021-02-11 ENCOUNTER — Telehealth: Payer: Self-pay

## 2021-02-11 NOTE — Telephone Encounter (Signed)
Mar/called patient to schedule MFM referral-VM Not Set Up Yet.

## 2021-02-17 ENCOUNTER — Other Ambulatory Visit: Payer: Self-pay

## 2021-02-17 DIAGNOSIS — Z363 Encounter for antenatal screening for malformations: Secondary | ICD-10-CM

## 2021-02-18 ENCOUNTER — Other Ambulatory Visit: Payer: Self-pay

## 2021-02-18 ENCOUNTER — Ambulatory Visit: Payer: Self-pay | Attending: Obstetrics and Gynecology

## 2021-02-18 ENCOUNTER — Ambulatory Visit: Payer: Self-pay | Admitting: *Deleted

## 2021-02-18 ENCOUNTER — Encounter: Payer: Self-pay | Admitting: *Deleted

## 2021-02-18 VITALS — BP 117/64 | HR 101 | Ht 63.0 in

## 2021-02-18 DIAGNOSIS — O361120 Maternal care for Anti-A sensitization, second trimester, not applicable or unspecified: Secondary | ICD-10-CM | POA: Insufficient documentation

## 2021-02-18 DIAGNOSIS — Z363 Encounter for antenatal screening for malformations: Secondary | ICD-10-CM | POA: Insufficient documentation

## 2021-02-19 ENCOUNTER — Telehealth: Payer: Self-pay

## 2021-02-19 ENCOUNTER — Other Ambulatory Visit: Payer: Self-pay | Admitting: *Deleted

## 2021-02-19 DIAGNOSIS — O361190 Maternal care for Anti-A sensitization, unspecified trimester, not applicable or unspecified: Secondary | ICD-10-CM

## 2021-02-19 NOTE — Telephone Encounter (Signed)
Left message to call me back regarding her follow up appointment in [place].

## 2021-02-20 ENCOUNTER — Telehealth: Payer: Self-pay

## 2021-02-20 NOTE — Telephone Encounter (Signed)
Patient called back before I got a chance to send My Chart appointment schedule. Patient is aware of her future appointments.

## 2021-02-20 NOTE — Telephone Encounter (Signed)
Called patient to make aware of future appointments - No answer  Will send a My Chart Message of appointments

## 2021-03-04 ENCOUNTER — Ambulatory Visit: Payer: Self-pay

## 2021-03-04 ENCOUNTER — Encounter: Payer: Self-pay | Admitting: Family Medicine

## 2021-03-04 DIAGNOSIS — Z3182 Encounter for Rh incompatibility status: Secondary | ICD-10-CM

## 2021-03-04 DIAGNOSIS — B1920 Unspecified viral hepatitis C without hepatic coma: Secondary | ICD-10-CM

## 2021-03-04 DIAGNOSIS — F1190 Opioid use, unspecified, uncomplicated: Secondary | ICD-10-CM

## 2021-03-05 ENCOUNTER — Encounter: Payer: Self-pay | Admitting: *Deleted

## 2021-03-05 ENCOUNTER — Other Ambulatory Visit: Payer: Self-pay | Admitting: Obstetrics and Gynecology

## 2021-03-05 ENCOUNTER — Other Ambulatory Visit: Payer: Self-pay

## 2021-03-05 ENCOUNTER — Ambulatory Visit: Payer: Self-pay | Attending: Obstetrics and Gynecology | Admitting: *Deleted

## 2021-03-05 ENCOUNTER — Ambulatory Visit (HOSPITAL_BASED_OUTPATIENT_CLINIC_OR_DEPARTMENT_OTHER): Payer: Self-pay

## 2021-03-05 VITALS — BP 115/48 | HR 79

## 2021-03-05 DIAGNOSIS — Z3A28 28 weeks gestation of pregnancy: Secondary | ICD-10-CM

## 2021-03-05 DIAGNOSIS — O98419 Viral hepatitis complicating pregnancy, unspecified trimester: Secondary | ICD-10-CM

## 2021-03-05 DIAGNOSIS — O361190 Maternal care for Anti-A sensitization, unspecified trimester, not applicable or unspecified: Secondary | ICD-10-CM

## 2021-03-05 DIAGNOSIS — O99323 Drug use complicating pregnancy, third trimester: Secondary | ICD-10-CM | POA: Insufficient documentation

## 2021-03-05 DIAGNOSIS — B182 Chronic viral hepatitis C: Secondary | ICD-10-CM

## 2021-03-05 DIAGNOSIS — F1910 Other psychoactive substance abuse, uncomplicated: Secondary | ICD-10-CM | POA: Insufficient documentation

## 2021-03-05 DIAGNOSIS — Z362 Encounter for other antenatal screening follow-up: Secondary | ICD-10-CM | POA: Insufficient documentation

## 2021-03-05 DIAGNOSIS — O98413 Viral hepatitis complicating pregnancy, third trimester: Secondary | ICD-10-CM | POA: Insufficient documentation

## 2021-03-05 DIAGNOSIS — O99322 Drug use complicating pregnancy, second trimester: Secondary | ICD-10-CM

## 2021-03-05 DIAGNOSIS — O368330 Maternal care for abnormalities of the fetal heart rate or rhythm, third trimester, not applicable or unspecified: Secondary | ICD-10-CM | POA: Insufficient documentation

## 2021-03-05 DIAGNOSIS — O361130 Maternal care for Anti-A sensitization, third trimester, not applicable or unspecified: Secondary | ICD-10-CM | POA: Insufficient documentation

## 2021-03-18 ENCOUNTER — Ambulatory Visit: Payer: Self-pay | Attending: Obstetrics and Gynecology

## 2021-03-18 ENCOUNTER — Encounter: Payer: Self-pay | Admitting: *Deleted

## 2021-03-18 ENCOUNTER — Ambulatory Visit (HOSPITAL_BASED_OUTPATIENT_CLINIC_OR_DEPARTMENT_OTHER): Payer: Self-pay | Admitting: Obstetrics and Gynecology

## 2021-03-18 ENCOUNTER — Other Ambulatory Visit: Payer: Self-pay | Admitting: *Deleted

## 2021-03-18 ENCOUNTER — Other Ambulatory Visit: Payer: Self-pay

## 2021-03-18 ENCOUNTER — Ambulatory Visit: Payer: Self-pay | Admitting: *Deleted

## 2021-03-18 ENCOUNTER — Other Ambulatory Visit: Payer: Self-pay | Admitting: Obstetrics and Gynecology

## 2021-03-18 VITALS — BP 132/69 | HR 90

## 2021-03-18 DIAGNOSIS — B182 Chronic viral hepatitis C: Secondary | ICD-10-CM | POA: Insufficient documentation

## 2021-03-18 DIAGNOSIS — O99323 Drug use complicating pregnancy, third trimester: Secondary | ICD-10-CM | POA: Insufficient documentation

## 2021-03-18 DIAGNOSIS — O98419 Viral hepatitis complicating pregnancy, unspecified trimester: Secondary | ICD-10-CM

## 2021-03-18 DIAGNOSIS — F1910 Other psychoactive substance abuse, uncomplicated: Secondary | ICD-10-CM | POA: Insufficient documentation

## 2021-03-18 DIAGNOSIS — O99320 Drug use complicating pregnancy, unspecified trimester: Secondary | ICD-10-CM

## 2021-03-18 DIAGNOSIS — O361930 Maternal care for other isoimmunization, third trimester, not applicable or unspecified: Secondary | ICD-10-CM | POA: Insufficient documentation

## 2021-03-18 DIAGNOSIS — Z3A30 30 weeks gestation of pregnancy: Secondary | ICD-10-CM | POA: Insufficient documentation

## 2021-03-18 DIAGNOSIS — O361190 Maternal care for Anti-A sensitization, unspecified trimester, not applicable or unspecified: Secondary | ICD-10-CM | POA: Insufficient documentation

## 2021-03-18 DIAGNOSIS — Z362 Encounter for other antenatal screening follow-up: Secondary | ICD-10-CM

## 2021-03-18 DIAGNOSIS — O98413 Viral hepatitis complicating pregnancy, third trimester: Secondary | ICD-10-CM

## 2021-03-18 DIAGNOSIS — F1120 Opioid dependence, uncomplicated: Secondary | ICD-10-CM

## 2021-03-18 NOTE — Progress Notes (Signed)
Maternal-Fetal Medicine   Name: Leslie Klein DOB: July 02, 1996 MRN: [id number] Referring Provider: Lester Tomahawk, MD  I had the pleasure of [facility]. She is G3 P1011 at 30w 5d gestation and is here for ultrasound and consultation. Leslie Klein is patient of [facility] and has been coming to our office for middle-cerebral artery Doppler (MCA) studies.  Patient is still undecided about the place of delivery.  She made new OB appointment at [facility] but did not follow through.  [place]  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 -Chronic hepatitis C infection. -Methadone maintenance (patient gets her medications from a Methadone Clinic).  On routine prenatal screening she had anti-D antibodies that had not reached a critical level (1:16 or 1:32).  Patient has been having middle cerebral artery Doppler studies with maternal-fetal medicine specialist at [facility].  She had genetic counseling and [facility] consultations.  Patient gives history of IV drug use but reports that she has not taken IV drugs for the past 6 months.  She is on methadone maintenance and takes 25 mg daily.  She does not report any withdrawal symptoms.  Past medical history is also significant for chronic hepatitis C infection with a high viral RNA.  He does not have HIV infection.  She has a diagnosis of depression and takes Zoloft prescribed by her physician.  She also reports taking Xanax (not prescribed by her physician).  Past surgical history: Early termination of pregnancy. Medications: Prenatal vitamins, methadone, Xanax, Zoloft 100 mg daily. Allergies: Amoxicillin (anaphylaxis), penicillin (rashes). Social history: She reports she smokes cigarettes half pack per day.  She does not use alcohol or drugs.  Her partner was Caucasian but,  unfortunately, died of overdose in 2020/08/22. Family history: No history of venous thromboembolism in the family. Obstetric history significant for a term vaginal delivery 2018 of a female infant weighing 5 pounds 13 ounces at birth.  Her son is in good health.  In May 2020 when she had an early termination of pregnancy without complications. GYN history: Her previous cycles were reportedly regular.  She does not give history of abnormal Pap smears or cervical surgeries.  No history of breast disease.  Prenatal course: She had opted not to screen for fetal aneuploidies.  Midtrimester fetal anatomy scan was reported as normal.  Her pregnancy is well dated by 10-week ultrasound (10/24/20).  Labs (March 2022) Hemoglobin 12.8, hematocrit 38, WBC 8.3, platelets 429, Hb AA, HBsAg neg, HIV nonreactive, rubella immune. HCV RNA 354,000 IU/mL. 12/16/2020: Fentanyl/nor fentanyl positive.  Ultrasound Patient returned for fetal growth assessment and MCA Doppler studies.  On today's ultrasound, amniotic fluid is normal good fetal activity seen.  Fetal growth is appropriate for gestational age.  Middle cerebral artery Doppler showed normal peak systolic velocity measurements (no evidence of fetal anemia).  Our concerns include Anti-D antibodies -I reassured her that the titers are low and had not reached a critical level to cause fetal anemia. -Given that she is having regular MCA Doppler studies, repeat titers are not necessary. -Although anti-D antibodies are too low to cause fetal hemolysis, frequent surveillance is necessary to detect fetal anemia. -If MCA Doppler studies show peak systolic velocity measurement greater than 1.5 MoM, fetal blood sampling is indicated.  And if fetal anemia is confirmed, intrauterine blood transfusion would be recommended. -In the absence of  fetal anemia, the pregnancy can proceed to 3939 weeks.  I have recommend delivery at [redacted] weeks gestation.  Hepatitis C infection in  pregnancy In the absence of co-infection with HIV, Mother-to-Child-Transmission is low and is around 5% regardless of mode of delivery. Patient understands that this is a chronic disease. Provided the liver functions are normal, it is unlikely to adversely affect the pregnancy outcome. Presence of high viral RNA can be associated with increased perinatal transmission and some have advocated cesarean delivery if high viral RNA is present. However, the current recommendation is that the patient should be encouraged to have vaginal delivery. Breastfeeding is not contraindicated.   Direct-acting antivirals (DAAs) has been studied in pregnancy (Phase I trial). Trial using ledipasvir-sofosbuvir (LDV/SOF) in 9 pregnant women starting in the second trimester (23-24 weeks) showed all patients were cured of HCV with no perinatal transmission.  Currently, hepatitis C is not treated with antivirals in pregnancy.  Methadone maintenance Methadone is one of the first drugs used in treating opioid addiction in pregnancy. Methadone treatment (compared to untreated heroin use) has been shown to improve pregnancy outcomes including increased fetal growth (and birth weights), decreased fetal mortality, decreased risk of preeclampsia and HIV infections, and increased likelihood of the neonate being discharged sooner. Concomitant substance use adversely affects the pregnancy outcomes. Neonatal abstinence syndrome (NAS) can occur in 50% to 80% of women. No increased adverse effects are seen with breastfeeding.  Prenatal care Patient is still undecided about the place of delivery.  At her initial visit with [place] she had clearly indicated that she would like to deliver at [facility], [place].  She made an appointment to see an obstetrician on 03/04/2021 but did not show for the appointment.  I called [facility] where she initiated her prenatal care and spoke to Ms. Baker Pierini, the nurse.  I learned that they  have been trying to contact this patient was not had a prenatal visit since May 2022.  They had received my first ultrasound report and I will email other reports to Ms. Lalla Brothers.  I had strongly emphasized the importance of initiating prenatal care at [facility].  Choice of the place of delivery with good NICU facilities is important.  Recommendations -An appointment was made for her to return in 2 weeks for BPP and MCA Doppler studies, and then weekly BPP and MCA till delivery. -Induction of labor may be considered at [redacted] weeks gestation.  Early term delivery may be advised based on antenatal testing. -Continue methadone. -Patient [facility] [place].  Thank you for consultation.  If you have any questions or concerns, please contact me the [facility].  Consultation including face-to-face (more than 50%) counseling 50 minutes.

## 2021-04-01 ENCOUNTER — Ambulatory Visit: Payer: Medicaid Other | Admitting: *Deleted

## 2021-04-01 ENCOUNTER — Other Ambulatory Visit: Payer: Self-pay

## 2021-04-01 ENCOUNTER — Encounter: Payer: Self-pay | Admitting: *Deleted

## 2021-04-01 ENCOUNTER — Ambulatory Visit: Payer: Medicaid Other | Attending: Maternal & Fetal Medicine

## 2021-04-01 VITALS — BP 117/67 | HR 86

## 2021-04-01 DIAGNOSIS — O99323 Drug use complicating pregnancy, third trimester: Secondary | ICD-10-CM | POA: Diagnosis not present

## 2021-04-01 DIAGNOSIS — O361930 Maternal care for other isoimmunization, third trimester, not applicable or unspecified: Secondary | ICD-10-CM | POA: Diagnosis not present

## 2021-04-01 DIAGNOSIS — F1910 Other psychoactive substance abuse, uncomplicated: Secondary | ICD-10-CM

## 2021-04-01 DIAGNOSIS — Z3A32 32 weeks gestation of pregnancy: Secondary | ICD-10-CM

## 2021-04-01 DIAGNOSIS — B182 Chronic viral hepatitis C: Secondary | ICD-10-CM

## 2021-04-01 DIAGNOSIS — O99320 Drug use complicating pregnancy, unspecified trimester: Secondary | ICD-10-CM | POA: Diagnosis not present

## 2021-04-01 DIAGNOSIS — O98413 Viral hepatitis complicating pregnancy, third trimester: Secondary | ICD-10-CM | POA: Diagnosis not present

## 2021-04-03 ENCOUNTER — Telehealth: Payer: Self-pay | Admitting: Family Medicine

## 2021-04-03 NOTE — Telephone Encounter (Signed)
Please call patient and get her to reschedule new OB appt, she is high risk and needs to be seen ASAP, please schedule with me.

## 2021-04-08 ENCOUNTER — Encounter: Payer: Medicaid Other | Admitting: Family Medicine

## 2021-04-09 ENCOUNTER — Ambulatory Visit: Payer: Medicaid Other

## 2021-04-09 ENCOUNTER — Ambulatory Visit: Payer: Medicaid Other | Attending: Maternal & Fetal Medicine

## 2021-04-16 ENCOUNTER — Ambulatory Visit: Payer: Medicaid Other | Attending: Maternal & Fetal Medicine

## 2021-04-16 ENCOUNTER — Ambulatory Visit: Payer: Medicaid Other

## 2021-04-22 ENCOUNTER — Ambulatory Visit: Payer: Medicaid Other | Admitting: *Deleted

## 2021-04-22 ENCOUNTER — Other Ambulatory Visit: Payer: Self-pay

## 2021-04-22 ENCOUNTER — Ambulatory Visit: Payer: Medicaid Other | Attending: Maternal & Fetal Medicine

## 2021-04-22 ENCOUNTER — Other Ambulatory Visit: Payer: Self-pay | Admitting: Maternal & Fetal Medicine

## 2021-04-22 ENCOUNTER — Encounter: Payer: Self-pay | Admitting: *Deleted

## 2021-04-22 ENCOUNTER — Other Ambulatory Visit (HOSPITAL_COMMUNITY): Payer: Self-pay | Admitting: Obstetrics and Gynecology

## 2021-04-22 VITALS — BP 129/71 | HR 96

## 2021-04-22 DIAGNOSIS — O361130 Maternal care for Anti-A sensitization, third trimester, not applicable or unspecified: Secondary | ICD-10-CM | POA: Diagnosis present

## 2021-04-22 DIAGNOSIS — O99320 Drug use complicating pregnancy, unspecified trimester: Secondary | ICD-10-CM

## 2021-04-22 DIAGNOSIS — O99323 Drug use complicating pregnancy, third trimester: Secondary | ICD-10-CM | POA: Diagnosis not present

## 2021-04-22 DIAGNOSIS — Z3A35 35 weeks gestation of pregnancy: Secondary | ICD-10-CM

## 2021-04-22 DIAGNOSIS — B182 Chronic viral hepatitis C: Secondary | ICD-10-CM | POA: Diagnosis not present

## 2021-04-22 DIAGNOSIS — O98413 Viral hepatitis complicating pregnancy, third trimester: Secondary | ICD-10-CM | POA: Diagnosis not present

## 2021-04-22 DIAGNOSIS — O361930 Maternal care for other isoimmunization, third trimester, not applicable or unspecified: Secondary | ICD-10-CM | POA: Diagnosis not present

## 2021-04-22 DIAGNOSIS — F1910 Other psychoactive substance abuse, uncomplicated: Secondary | ICD-10-CM

## 2021-04-22 DIAGNOSIS — O360920 Maternal care for other rhesus isoimmunization, second trimester, not applicable or unspecified: Secondary | ICD-10-CM

## 2021-04-28 ENCOUNTER — Ambulatory Visit: Payer: Medicaid Other | Attending: Obstetrics and Gynecology

## 2021-04-28 ENCOUNTER — Ambulatory Visit: Payer: Medicaid Other

## 2021-04-29 ENCOUNTER — Other Ambulatory Visit: Payer: Self-pay

## 2021-04-29 ENCOUNTER — Encounter (HOSPITAL_COMMUNITY): Payer: Self-pay

## 2021-04-29 ENCOUNTER — Emergency Department (HOSPITAL_COMMUNITY)
Admission: EM | Admit: 2021-04-29 | Discharge: 2021-04-29 | Disposition: A | Discharge status: Home / Self Care | Payer: Medicaid Other | Attending: Emergency Medicine | Admitting: Emergency Medicine

## 2021-04-29 ENCOUNTER — Telehealth: Payer: Self-pay

## 2021-04-29 DIAGNOSIS — F17210 Nicotine dependence, cigarettes, uncomplicated: Secondary | ICD-10-CM | POA: Insufficient documentation

## 2021-04-29 DIAGNOSIS — O1203 Gestational edema, third trimester: Secondary | ICD-10-CM | POA: Diagnosis present

## 2021-04-29 DIAGNOSIS — O99333 Smoking (tobacco) complicating pregnancy, third trimester: Secondary | ICD-10-CM | POA: Insufficient documentation

## 2021-04-29 DIAGNOSIS — Z3A36 36 weeks gestation of pregnancy: Secondary | ICD-10-CM | POA: Diagnosis not present

## 2021-04-29 DIAGNOSIS — R600 Localized edema: Secondary | ICD-10-CM

## 2021-04-29 MED ORDER — ENOXAPARIN SODIUM 60 MG/0.6ML IJ SOSY
1.0000 mg/kg | PREFILLED_SYRINGE | Freq: Once | INTRAMUSCULAR | Status: AC
  Administered 2021-04-29: 60 mg via SUBCUTANEOUS
  Filled 2021-04-29: qty 0.6

## 2021-04-29 NOTE — ED Provider Notes (Signed)
Emergency Medicine Provider Triage Evaluation Note  Leslie Klein , a 26 y.o. female  was evaluated in triage.  Pt complains of swelling to bilateral lower extremities.  Right swelling greater than left.  Patient is currently [redacted] weeks pregnant.  Review of Systems  Positive: Bilateral leg swelling Negative: Vaginal pain, vaginal bleeding, vaginal discharge  Physical Exam  BP 129/84 (BP Location: Right Arm)   Pulse (!) 102   Temp 97.9 F (36.6 C)   Resp 16   Ht 5\' 3"  (1.6 m)   Wt 60.2 kg   LMP 08/08/2020   SpO2 93%   BMI 23.52 kg/m  Gen:   Awake, no distress   Resp:  Normal effort  MSK:   Moves extremities without difficulty, swelling to bilateral lower extremities no tenderness to palpation. Other:    Medical Decision Making  Medically screening exam initiated at 6:54 PM.  Appropriate orders placed.  14/05/2020 was informed that the remainder of the evaluation will be completed by another provider, this initial triage assessment does not replace that evaluation, and the importance of remaining in the ED until their evaluation is complete.     Leslie Klein 04/29/21 05/01/21, MD 05/02/21 [phone]

## 2021-04-29 NOTE — ED Triage Notes (Signed)
Pt presents to ED with complaints of swelling in bilateral lower extremities started yesterday. Pt is currently [redacted] weeks pregnant. Pt reports +FM. Pt denies contractions, vaginal bleeding or leaking fluid. Pt states she was kicked out of her OB practice for failure to keep appointments.

## 2021-04-29 NOTE — Discharge Instructions (Addendum)
You came to the [facility] today to be evaluated for your lower leg swelling.  You left prior to lab work being obtained to evaluate for HELLP syndrome.  Due to this there is a risk that you and your pregnancy could be at risk.  Due to your lower leg swelling there is concern for possible blood clot also known as a DVT in your lower extremities.  You received the blood thinner Lovenox today as ultrasound imaging cannot be obtained.  You will need to have an ultrasound tomorrow here at [facility] emergency department.  Please call [phone] to schedule an appointment.    Get help right away if: You develop shortness of breath or chest pain. You cannot breathe when you lie down. You develop pain, redness, or warmth in the swollen areas. You have heart, liver, or kidney disease and suddenly get edema. You have a fever and your symptoms suddenly get worse.

## 2021-04-29 NOTE — ED Provider Notes (Signed)
[facility] EMERGENCY DEPARTMENT Provider Note   CSN: [id number] Arrival date & time: 04/29/21  1557     History Chief Complaint  Patient presents with   Leg Swelling    Leslie Klein is a 25 y.o. female with medical history as noted below.  Patient is [redacted] weeks pregnant.  Patient presents emergency department with chief complaint of bilateral lower leg swelling.  Patient reports that swelling started 2 days prior.  Swelling is to both legs however right leg is greater than left.  Patient denies any pain associated with swelling.  No recent falls or injuries.  Patient denies any numbness, weakness, vaginal pain, vaginal bleeding, vaginal discharge, dysuria, hematuria, urinary frequency, rash, color change, abdominal pain, nausea, vomiting.  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  Patient reports that she has not had follow-up with OB/GYN for multiple weeks due to missing appointments previously.  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   Cocaine abuse (HCC) 05/06/2017    Past Surgical History:  Procedure Laterality Date   NO PAST SURGERIES       OB History     Gravida  3   Para  1   Term  1   Preterm      AB  1   Living  1      SAB      IAB  1   Ectopic      Multiple      Live Births              Family History  Problem Relation Age of Onset   Hypertension Maternal Grandmother    Diabetes Maternal Grandfather     Social History   Tobacco Use   Smoking status: Every Day    Packs/day:  0.50    Types: Cigarettes   Smokeless tobacco: Never  Vaping Use   Vaping Use: Never used  Substance Use Topics   Alcohol use: No   Drug use: Yes    Comment: past per pt    Home Medications Prior to Admission medications   Medication Sig Start Date End Date Taking? Authorizing Provider  ARIPiprazole (ABILIFY) 5 MG tablet Take 5 mg by mouth daily.    [provider]  clindamycin (CLEOCIN) 150 MG capsule Take 150 mg by mouth 4 (four) times daily. Patient not taking: Reported on 04/01/2021 03/01/21   [provider]  methadone (DOLOPHINE) 10 MG/5ML solution Take 25 mg by mouth.    [provider]  METHADONE HCL PO Take 50 mg by mouth. Patient not taking: Reported on 03/18/2021    [provider]  Prenatal Vit-Fe Fumarate-FA (PRENATAL MULTIVITAMIN) TABS tablet Take 1 tablet by mouth daily at 12 noon.    [provider]  promethazine (PHENERGAN) 25 MG tablet SMARTSIG:1 Tablet(s) By Mouth Every 12 Hours 01/02/21   [provider]  sertraline (ZOLOFT) 100 MG tablet Take 100 mg by mouth daily.  [provider]    Allergies    Penicillins and Amoxicillin  Review of Systems   Review of Systems  Constitutional:  Negative for chills and fever.  Eyes:  Negative for visual disturbance.  Respiratory:  Negative for cough and shortness of breath.   Cardiovascular:  Positive for leg swelling. Negative for chest pain and palpitations.  Gastrointestinal:  Negative for abdominal pain, diarrhea, nausea and vomiting.  Genitourinary:  Negative for difficulty urinating, dysuria, frequency, hematuria, pelvic pain, vaginal bleeding, vaginal discharge and vaginal pain.  Musculoskeletal:  Negative for back pain and neck pain.  Skin:  Negative for color change, pallor, rash and wound.  Neurological:  Negative for dizziness, syncope, light-headedness and headaches.  Psychiatric/Behavioral:  Negative for confusion.    Physical Exam Updated Vital  Signs BP 129/84 (BP Location: Right Arm)   Pulse (!) 102   Temp 97.9 F (36.6 C)   Resp 16   Ht 5\' 3"  (1.6 m)   Wt 60.2 kg   LMP 08/08/2020   SpO2 93%   BMI 23.52 kg/m   Physical Exam Vitals and nursing note reviewed.  Constitutional:      General: She is not in acute distress.    Appearance: She is not ill-appearing, toxic-appearing or diaphoretic.  HENT:     Head: Normocephalic.  Eyes:     General: No scleral icterus.       Right eye: No discharge.        Left eye: No discharge.  Cardiovascular:     Rate and Rhythm: Normal rate.     Pulses:          Dorsalis pedis pulses are 2+ on the right side and 2+ on the left side.  Pulmonary:     Effort: Pulmonary effort is normal.  Musculoskeletal:     Right lower leg: No deformity, lacerations, tenderness or bony tenderness. 3+ Edema present.     Left lower leg: No deformity, lacerations, tenderness or bony tenderness. 2+ Edema present.     Right ankle: Swelling present. No deformity, ecchymosis or lacerations. No tenderness. Normal range of motion.     Left ankle: Swelling present. No deformity, ecchymosis or lacerations. No tenderness. Normal range of motion.     Right foot: Normal range of motion and normal capillary refill. Swelling present. No deformity, laceration, tenderness, bony tenderness or crepitus. Normal pulse.     Left foot: Normal range of motion and normal capillary refill. Swelling present. No deformity, laceration, tenderness, bony tenderness or crepitus. Normal pulse.  Feet:     Right foot:     Skin integrity: Skin integrity normal.     Left foot:     Skin integrity: Skin integrity normal.  Skin:    General: Skin is warm and dry.  Neurological:     General: No focal deficit present.     Mental Status: She is alert.  Psychiatric:        Behavior: Behavior is cooperative.    ED Results / Procedures / Treatments   Labs (all labs ordered are listed, but only abnormal results are displayed) Labs Reviewed   HEPATIC FUNCTION PANEL    EKG None  Radiology No results found.  Procedures Procedures   Medications Ordered in ED Medications - No data to display  ED Course  I have reviewed the triage vital signs and the nursing notes.  Pertinent labs & imaging results that were available during my care of the patient were reviewed by me and  considered in my medical decision making (see chart for details).    MDM Rules/Calculators/A&P                           Alert 25 year old female no acute distress, nontoxic appearing.  Presents to ED with chief complaint of bilateral lower leg swelling.  Patient is [redacted] weeks pregnant.  The patient's pregnancy concern for possible help syndrome.  Patient is not noted to be hypertensive.  Will obtain hepatic function.  Due to patient's lower leg swelling with right greater than left concern for possible DVT, ultrasound imaging unavailable at this time.  Patient will need dose of Lovenox and return tomorrow for ultrasound imaging.  Patient denies any history of bleeding issues.  After failed attempt to obtain blood sample for lab patient is refusing any further attempts.  Discussed that without lab work we cannot fully assess if patient has HELLP syndrome.  Explained to patient that if if she has HELLP syndrome and it has not properly treated her pregnancy could be at risk and could lead to life-threatening complications.  Patient expresses understanding of this and continues to refuse lab draw at this time.  Will discharge patient at this time.  Patient was given Lovenox in emergency department.  Patient given ambulatory referral for additional imaging.  Informed of ultrasound imaging stressed to patient.  Discussed results, findings, treatment and follow up. Patient advised of return precautions. Patient verbalized understanding and agreed with plan.   Final Clinical Impression(s) / ED Diagnoses Final diagnoses:  Leg edema    Rx / DC Orders ED  Discharge Orders          Ordered    US Venous Img Lower Bilateral        04/29/21 2112             Haskel Schroeder, PA-C 04/30/21 0131    Benjiman Core, MD 05/02/21 [phone]

## 2021-04-29 NOTE — Telephone Encounter (Signed)
Patient called to reschedule missed appointment from 04/28/21 - has appointment for NST next Tuesday 05/05/21.   Explained to patient that our schedule is full and please be sure to make next Tuesdays appointment.

## 2021-05-05 ENCOUNTER — Other Ambulatory Visit: Payer: Self-pay

## 2021-05-05 ENCOUNTER — Encounter: Payer: Self-pay | Admitting: *Deleted

## 2021-05-05 ENCOUNTER — Ambulatory Visit: Payer: Medicaid Other | Attending: Obstetrics and Gynecology | Admitting: *Deleted

## 2021-05-05 ENCOUNTER — Ambulatory Visit: Payer: Medicaid Other | Admitting: *Deleted

## 2021-05-05 ENCOUNTER — Telehealth: Payer: Self-pay

## 2021-05-05 VITALS — BP 129/68 | HR 92

## 2021-05-05 DIAGNOSIS — F1190 Opioid use, unspecified, uncomplicated: Secondary | ICD-10-CM

## 2021-05-05 DIAGNOSIS — O98513 Other viral diseases complicating pregnancy, third trimester: Secondary | ICD-10-CM | POA: Diagnosis not present

## 2021-05-05 DIAGNOSIS — O99323 Drug use complicating pregnancy, third trimester: Secondary | ICD-10-CM | POA: Diagnosis not present

## 2021-05-05 DIAGNOSIS — B182 Chronic viral hepatitis C: Secondary | ICD-10-CM | POA: Insufficient documentation

## 2021-05-05 DIAGNOSIS — Z3A37 37 weeks gestation of pregnancy: Secondary | ICD-10-CM | POA: Diagnosis not present

## 2021-05-05 DIAGNOSIS — O361130 Maternal care for Anti-A sensitization, third trimester, not applicable or unspecified: Secondary | ICD-10-CM

## 2021-05-05 NOTE — Telephone Encounter (Signed)
Patient left VM on nurse line requesting a new OB appt. Called pt back who states she stopped at front desk to schedule new ob appt but was told she needed to send records prior to scheduling. Pt states she asked her former office to send records today. Per chart review, pt has no showed 2 prior new OB appts with our office this pregnancy. Reviewed with Crissie Reese, MD who agrees for patient to be added to his schedule tomorrow at 3:55.

## 2021-05-05 NOTE — Procedures (Signed)
Leslie Klein 1996-08-25 [redacted]w[redacted]d  Fetus A Non-Stress Test Interpretation for 05/05/21  Indication:  Chronic Hep C, Methadone  Fetal Heart Rate A Mode: External Baseline Rate (A): 120 bpm Variability: Moderate Accelerations: 15 x 15 Decelerations: None Multiple birth?: No  Uterine Activity Mode: Palpation, Toco Contraction Frequency (min): Occas with UI Contraction Quality: Mild Resting Tone Palpated: Relaxed Resting Time: Adequate  Interpretation (Fetal Testing) Nonstress Test Interpretation: Reactive Comments: Dr. Grace Bushy reviewed tracing.

## 2021-05-06 ENCOUNTER — Ambulatory Visit (INDEPENDENT_AMBULATORY_CARE_PROVIDER_SITE_OTHER): Payer: Medicaid Other | Admitting: Family Medicine

## 2021-05-06 ENCOUNTER — Other Ambulatory Visit (HOSPITAL_COMMUNITY)
Admission: RE | Admit: 2021-05-06 | Discharge: 2021-05-06 | Disposition: A | Discharge status: Home / Self Care | Payer: Medicaid Other | Source: Ambulatory Visit | Attending: Family Medicine | Admitting: Family Medicine

## 2021-05-06 ENCOUNTER — Encounter: Payer: Self-pay | Admitting: Family Medicine

## 2021-05-06 VITALS — BP 115/77 | HR 93

## 2021-05-06 DIAGNOSIS — O0990 Supervision of high risk pregnancy, unspecified, unspecified trimester: Secondary | ICD-10-CM

## 2021-05-06 DIAGNOSIS — F1129 Opioid dependence with unspecified opioid-induced disorder: Secondary | ICD-10-CM

## 2021-05-06 DIAGNOSIS — O360990 Maternal care for other rhesus isoimmunization, unspecified trimester, not applicable or unspecified: Secondary | ICD-10-CM

## 2021-05-06 DIAGNOSIS — Z3182 Encounter for Rh incompatibility status: Secondary | ICD-10-CM

## 2021-05-06 DIAGNOSIS — B1920 Unspecified viral hepatitis C without hepatic coma: Secondary | ICD-10-CM

## 2021-05-06 DIAGNOSIS — F419 Anxiety disorder, unspecified: Secondary | ICD-10-CM

## 2021-05-06 NOTE — Progress Notes (Signed)
Subjective:   Leslie Klein is a 25 y.o. G3P1011 at [redacted]w[redacted]d by early ultrasound being seen today for her first obstetrical visit.  Her obstetrical history is significant for  polysubstance use disorder, Rh isoimmunization, hepatitis C . Patient does intend to breast feed "if required to or it will be helpful for the baby." Pregnancy history fully reviewed.  Patient reports  she is transferring her care from [place] to [facility] as she plans to deliver at [facility] . Reports history of polysubstance use. Currently endorses using subutex which is prescribed by Dr. Cathey Endow in [place]. She also reports taking non-prescribed benzo's which she buys illicitly. Denies any other substance use for many months.   She has been following with MFM for many months due to Rh isoimmunization. Patient believes this happened due to not receiving rhogam after her first delivery four years ago. She does endorse having shared needles for intravenous drug use once with FOB. She reports he was released from prison and died about a week later and that is the only time she recalls sharing needles, she knows he was Rh+. She also believes this is when she contracted hepatitis C.  Prenatal records reviewed in full from [facility] in [place], [place] (in media tab dated 02/20/21).  HISTORY: OB History  Gravida Para Term Preterm AB Living  3 1 1  0 1 1  SAB IAB Ectopic Multiple Live Births  0 1 0 0 1    # Outcome Date GA Lbr Len/2nd Weight Sex Delivery Anes PTL Lv  3 Current           2 IAB 2021          1 Term 12/26/16   5 lb 13 oz (2.637 kg) M Vag-Spont  N LIV     Last pap smear: needs   Past Medical History:  Diagnosis Date   Anemia    Heart murmur    Hepatitis C    Heroin abuse (HCC)    Opioid abuse (HCC)    Substance abuse (HCC)    Substance abuse (HCC)    Past Surgical History:  Procedure Laterality Date   NO PAST SURGERIES     Family History  Problem Relation Age of Onset   Hypertension Maternal  Grandmother    Diabetes Maternal Grandfather    Social History   Tobacco Use   Smoking status: Every Day    Packs/day: 0.50    Types: Cigarettes   Smokeless tobacco: Never  Vaping Use   Vaping Use: Never used  Substance Use Topics   Alcohol use: No   Drug use: Yes    Comment: past per pt   Allergies  Allergen Reactions   Penicillins Anaphylaxis   Amoxicillin Rash   Current Outpatient Medications on File Prior to Visit  Medication Sig Dispense Refill   buprenorphine (SUBUTEX) 2 MG SUBL SL tablet Place 4 mg under the tongue daily.     Prenatal Vit-Fe Fumarate-FA (PRENATAL MULTIVITAMIN) TABS tablet Take 1 tablet by mouth daily at 12 noon.     promethazine (PHENERGAN) 25 MG tablet SMARTSIG:1 Tablet(s) By Mouth Every 12 Hours     ARIPiprazole (ABILIFY) 5 MG tablet Take 5 mg by mouth daily. (Patient not taking: Reported on 05/06/2021)     clindamycin (CLEOCIN) 150 MG capsule Take 150 mg by mouth 4 (four) times daily. (Patient not taking: No sig reported)     methadone (DOLOPHINE) 10 MG/5ML solution Take 25 mg by mouth. (Patient not  taking: Reported on 05/06/2021)     METHADONE HCL PO Take 50 mg by mouth. (Patient not taking: No sig reported)     sertraline (ZOLOFT) 100 MG tablet Take 100 mg by mouth daily. (Patient not taking: No sig reported)     No current facility-administered medications on file prior to visit.     Exam   Vitals:   05/06/21 1612  BP: 115/77  Pulse: 93   Fetal Heart Rate (bpm): 141  System: General: well-developed, well-nourished female in no acute distress   Skin: normal coloration and turgor, no rashes   Neurologic: oriented, normal, negative, normal mood   Extremities: normal strength, tone, and muscle mass, ROM of all joints is normal   HEENT PERRLA, extraocular movement intact and sclera clear, anicteric   Neck supple and no masses   Respiratory:  no respiratory distress   CV: RRR, no murmurs. 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 Supervision of high risk pregnancy, antepartum Patient transferring care due to desire to deliver at [facility] Records reviewed in full after visit, initial OB labs normal with exception of isoimmunization as noted below Given no recent labs and unclear if she has had third trimester labs OB panel, A1c, and antibody screen for updated titers was collected today by myself using [place] due to difficulty with finding access I strongly recommended collecting GBS swab with reflex sensitivies today due to high likelihood she would need to be induced and needing time for this to result Patient agreed to do self swab, while not ideal, better than not obtaining it at all - CBC/D/Plt+RPR+Rh+ABO+RubIgG... - HgB A1c - Cervicovaginal ancillary only( [place]) - Strep Gp B Culture+Rflx - Antibody screen  2. Opioid dependence with opioid-induced disorder (HCC) Welcomed patient to our clinic, discussed that we have significant experience treating patient with OUD and we strive to create an environment of trust and respect Reports prescription of subutex and illicit benzodiazapene use Review of PDMP after her visit shows no prescriptions for controlled substances have been filled since 01/10/2020, unclear if she has not yet filled an initial rx or if this requires further discussion after we have developed more trust Verbally consents to UDS, will discuss results at next visit Patient appeared somewhat sedated during our encounter As  part of harm reduction approach I offered her Narcan and clean injection supplies, if not for herself then for others she knows who may need it, she politely declined - ToxASSURE Select 13 (MW), Urine  3. 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 She is adamantly opposed to  this and wishes to go into labor naturally. We discussed balance of risks and benefits, and for time being have agreed on IOL at 39 weeks which was the original MFM recommendation at her initial consultation. Orders placed and form faxed.   4. Hepatitis C virus infection, unspecified chronicity Offer treatment post partum   Routine obstetric precautions reviewed, with particular emphasis on DFM.  Return in about 1 week (around 05/13/2021) for [facility], ob visit.

## 2021-05-07 LAB — CERVICOVAGINAL ANCILLARY ONLY
Chlamydia: NEGATIVE
Comment: NEGATIVE
Comment: NEGATIVE
Comment: NORMAL
Neisseria Gonorrhea: NEGATIVE
Trichomonas: NEGATIVE

## 2021-05-10 LAB — TOXASSURE SELECT 13 (MW), URINE

## 2021-05-10 LAB — STREP GP B CULTURE+RFLX: Strep Gp B Culture+Rflx: NEGATIVE

## 2021-05-11 LAB — CBC/D/PLT+RPR+RH+ABO+RUBIGG...
Basophils Absolute: 0 10*3/uL (ref 0.0–0.2)
Basos: 0 %
EOS (ABSOLUTE): 0.2 10*3/uL (ref 0.0–0.4)
Eos: 2 %
HCV Ab: >11 s/co ratio — ABNORMAL HIGH (ref 0.0–0.9)
HIV Screen 4th Generation wRfx: NONREACTIVE
Hematocrit: 27.4 % — ABNORMAL LOW (ref 34.0–46.6)
Hemoglobin: 9.5 g/dL — ABNORMAL LOW (ref 11.1–15.9)
Hepatitis B Surface Ag: NEGATIVE
Immature Grans (Abs): 0 10*3/uL (ref 0.0–0.1)
Immature Granulocytes: 0 %
Lymphocytes Absolute: 1.9 10*3/uL (ref 0.7–3.1)
Lymphs: 20 %
MCH: 29.3 pg (ref 26.6–33.0)
MCHC: 34.7 g/dL (ref 31.5–35.7)
MCV: 85 fL (ref 79–97)
Monocytes Absolute: 0.4 10*3/uL (ref 0.1–0.9)
Monocytes: 4 %
Neutrophils Absolute: 6.9 10*3/uL (ref 1.4–7.0)
Neutrophils: 74 %
Platelets: 329 10*3/uL (ref 150–450)
RBC: 3.24 x10E6/uL — ABNORMAL LOW (ref 3.77–5.28)
RDW: 13.1 % (ref 11.7–15.4)
RPR Ser Ql: NONREACTIVE
Rh Factor: NEGATIVE
Rubella Antibodies, IGG: 1.27 index (ref >0.99)
WBC: 9.4 10*3/uL (ref 3.4–10.8)

## 2021-05-11 LAB — AB SCR+ANTIBODY ID: Antibody Screen: POSITIVE — AB

## 2021-05-11 LAB — HEMOGLOBIN A1C
Est. average glucose Bld gHb Est-mCnc: 108 mg/dL
Hgb A1c MFr Bld: 5.4 % (ref 4.8–5.6)

## 2021-05-11 LAB — HCV RT-PCR, QUANT (NON-GRAPH)
HCV log10: 4.72 log10 IU/mL
Hepatitis C Quantitation: 52500 IU/mL

## 2021-05-12 ENCOUNTER — Ambulatory Visit: Payer: Medicaid Other

## 2021-05-12 ENCOUNTER — Encounter: Payer: Medicaid Other | Admitting: Family Medicine

## 2021-05-12 ENCOUNTER — Telehealth: Payer: Self-pay

## 2021-05-18 ENCOUNTER — Ambulatory Visit: Payer: Medicaid Other | Admitting: *Deleted

## 2021-05-18 ENCOUNTER — Ambulatory Visit: Payer: Medicaid Other | Attending: Obstetrics and Gynecology

## 2021-05-18 ENCOUNTER — Other Ambulatory Visit: Payer: Self-pay

## 2021-05-18 ENCOUNTER — Other Ambulatory Visit (HOSPITAL_COMMUNITY): Payer: Self-pay | Admitting: Obstetrics and Gynecology

## 2021-05-18 VITALS — BP 137/86 | HR 88

## 2021-05-18 DIAGNOSIS — O360920 Maternal care for other rhesus isoimmunization, second trimester, not applicable or unspecified: Secondary | ICD-10-CM | POA: Diagnosis not present

## 2021-05-18 DIAGNOSIS — O361130 Maternal care for Anti-A sensitization, third trimester, not applicable or unspecified: Secondary | ICD-10-CM | POA: Diagnosis present

## 2021-05-18 DIAGNOSIS — O99323 Drug use complicating pregnancy, third trimester: Secondary | ICD-10-CM

## 2021-05-18 DIAGNOSIS — O99322 Drug use complicating pregnancy, second trimester: Secondary | ICD-10-CM | POA: Diagnosis not present

## 2021-05-18 DIAGNOSIS — F1910 Other psychoactive substance abuse, uncomplicated: Secondary | ICD-10-CM | POA: Diagnosis not present

## 2021-05-18 DIAGNOSIS — O98413 Viral hepatitis complicating pregnancy, third trimester: Secondary | ICD-10-CM

## 2021-05-18 DIAGNOSIS — O0990 Supervision of high risk pregnancy, unspecified, unspecified trimester: Secondary | ICD-10-CM | POA: Insufficient documentation

## 2021-05-18 DIAGNOSIS — B182 Chronic viral hepatitis C: Secondary | ICD-10-CM

## 2021-05-18 DIAGNOSIS — Z362 Encounter for other antenatal screening follow-up: Secondary | ICD-10-CM

## 2021-05-18 DIAGNOSIS — Z3A39 39 weeks gestation of pregnancy: Secondary | ICD-10-CM

## 2021-05-18 NOTE — Procedures (Signed)
Leslie Klein 04/14/1996 [redacted]w[redacted]d  Fetus A Non-Stress Test Interpretation for 05/18/21  Indication:  Isoimmunization, Methadone, Chr Hep C  Fetal Heart Rate A Mode: External Baseline Rate (A): 125 bpm Variability: Moderate Accelerations: 15 x 15 Decelerations: None Multiple birth?: No  Uterine Activity Mode: Palpation, Toco Contraction Frequency (min): UI Contraction Quality: Mild Resting Tone Palpated: Relaxed Resting Time: Adequate  Interpretation (Fetal Testing) Nonstress Test Interpretation: Reactive Comments: Dr. Judeth Cornfield reviewed tracing.

## 2021-05-20 ENCOUNTER — Other Ambulatory Visit: Payer: Self-pay | Admitting: Advanced Practice Midwife

## 2021-05-20 ENCOUNTER — Other Ambulatory Visit: Payer: Self-pay

## 2021-05-20 ENCOUNTER — Ambulatory Visit (INDEPENDENT_AMBULATORY_CARE_PROVIDER_SITE_OTHER): Payer: Medicaid Other | Admitting: Family Medicine

## 2021-05-20 ENCOUNTER — Other Ambulatory Visit (HOSPITAL_COMMUNITY): Payer: Self-pay | Admitting: Obstetrics and Gynecology

## 2021-05-20 VITALS — BP 130/89 | HR 90 | Wt 129.8 lb

## 2021-05-20 DIAGNOSIS — O360990 Maternal care for other rhesus isoimmunization, unspecified trimester, not applicable or unspecified: Secondary | ICD-10-CM

## 2021-05-20 DIAGNOSIS — O0990 Supervision of high risk pregnancy, unspecified, unspecified trimester: Secondary | ICD-10-CM

## 2021-05-20 DIAGNOSIS — O360920 Maternal care for other rhesus isoimmunization, second trimester, not applicable or unspecified: Secondary | ICD-10-CM

## 2021-05-20 DIAGNOSIS — B1920 Unspecified viral hepatitis C without hepatic coma: Secondary | ICD-10-CM

## 2021-05-20 DIAGNOSIS — Z3182 Encounter for Rh incompatibility status: Secondary | ICD-10-CM

## 2021-05-20 DIAGNOSIS — F1129 Opioid dependence with unspecified opioid-induced disorder: Secondary | ICD-10-CM

## 2021-05-20 NOTE — Progress Notes (Signed)
   PRENATAL VISIT NOTE  Subjective:  Leslie Klein is a 25 y.o. G3P1011 at [redacted]w[redacted]d being seen today for ongoing prenatal care.  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  Patient reports occasional contractions.  Contractions: Irritability. Vag. Bleeding: None.  Movement: Present. Denies leaking of fluid.   The following portions of the patient's history were reviewed and updated as appropriate: allergies, current medications, past family history, past medical history, past social history, past surgical history and problem list.   Objective:   Vitals:   05/20/21 1526  BP: 130/89  Pulse: 90  Weight: 129 lb 12.8 oz (58.9 kg)    Fetal Status: Fetal Heart Rate (bpm): 116   Movement: Present     General:  Alert, oriented and cooperative. Patient is in no acute distress.  Skin: Skin is warm and dry. No rash noted.   Cardiovascular: Normal heart rate noted  Respiratory: Normal respiratory effort, no problems with respiration noted  Abdomen: Soft, gravid, appropriate for gestational age.  Pain/Pressure: Present     Pelvic: Cervical exam deferred        Extremities: Normal range of motion.  Edema: Trace  Mental Status: Normal mood and affect. Normal behavior. Normal judgment and thought content.   Assessment and Plan:  Pregnancy: G3P1011 at [redacted]w[redacted]d 1. Supervision of high risk pregnancy, antepartum FHT and FH normal  2. 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 Discussed recommendation to induce before due date. Patient amenable to 9/27, which is 4 days after EDC.   3. Anti-E isoimmunization affecting pregnancy,  antepartum, single or unspecified fetus  4. Hepatitis C virus infection, unspecified chronicity Will need treatment post delivery  5. Opioid dependence with opioid-induced disorder (HCC) Discussed UDS results.  Patient only taking subutex now. Discussed SW referral when in labor, along with likely CPS referral.  Also discussed 5 days in the hospital due to subutex use. WIll have neonatal NP call patient (message sent) Recommended breastfeeding (which is fine with Hep C) to help decrease withdrawal symptoms in baby.  Term labor symptoms and general obstetric precautions including but not limited to vaginal bleeding, contractions, leaking of fluid and fetal movement were reviewed in detail with the patient. Please refer to After Visit Summary for other counseling recommendations.   Return in about 5 weeks (around 06/24/2021) for Postpartum Exam.  No future appointments.  Levie Heritage, DO

## 2021-05-21 ENCOUNTER — Encounter: Payer: Self-pay | Admitting: General Practice

## 2021-05-22 ENCOUNTER — Telehealth (HOSPITAL_COMMUNITY): Payer: Self-pay | Admitting: *Deleted

## 2021-05-22 NOTE — Telephone Encounter (Signed)
Preadmission screen  

## 2021-05-26 ENCOUNTER — Inpatient Hospital Stay (HOSPITAL_COMMUNITY): Admit: 2021-05-26 | Payer: Medicaid Other | Admitting: Internal Medicine

## 2021-05-26 ENCOUNTER — Ambulatory Visit (HOSPITAL_COMMUNITY): Payer: Medicaid Other

## 2021-05-26 DIAGNOSIS — O0990 Supervision of high risk pregnancy, unspecified, unspecified trimester: Secondary | ICD-10-CM

## 2021-06-26 ENCOUNTER — Ambulatory Visit: Payer: Medicaid Other | Admitting: Obstetrics and Gynecology

## 2022-08-22 IMAGING — US US MFM OB FOLLOW-UP
1 series · 13 of 28 positions shown · non-contrast
Comparison: none

[Series 1: us mfm ob follow-up · 13 of 41 slices shown]
[im 2/41]
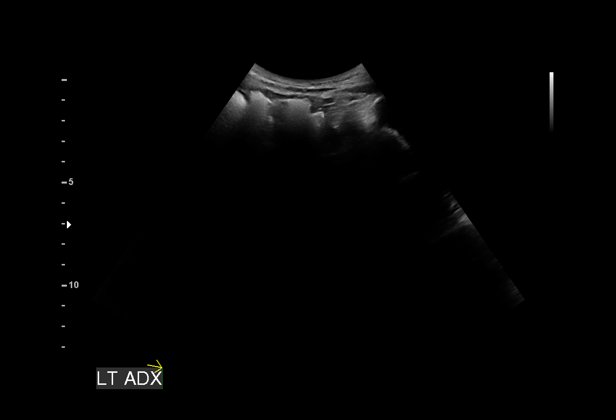
[im 5/41]
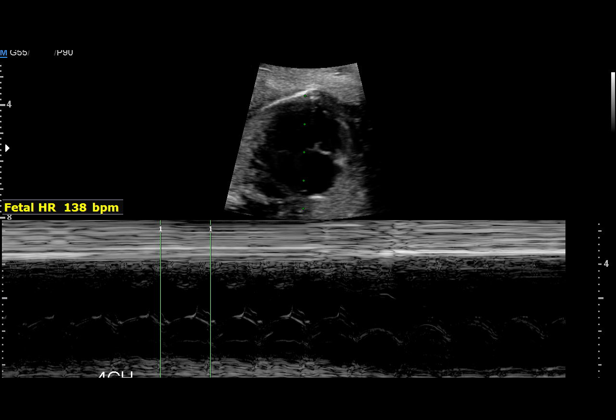
[im 8/41]
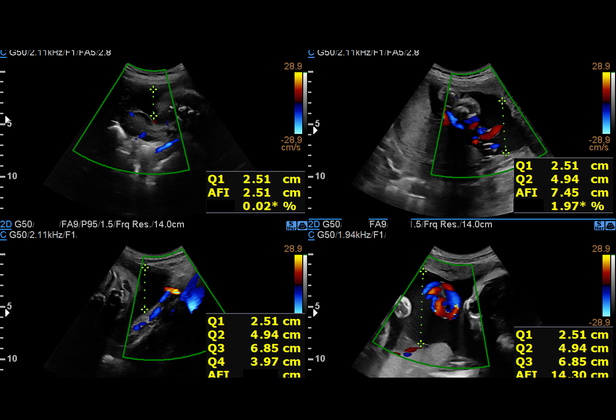
[im 11/41]
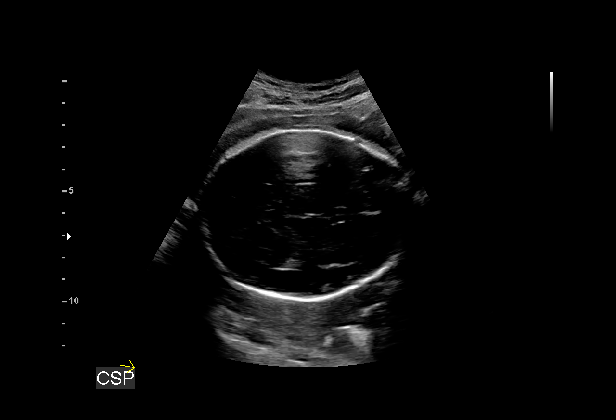
[im 14/41]
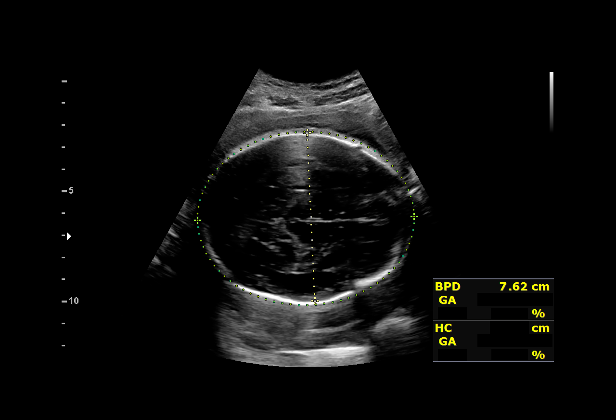
[im 17/41]
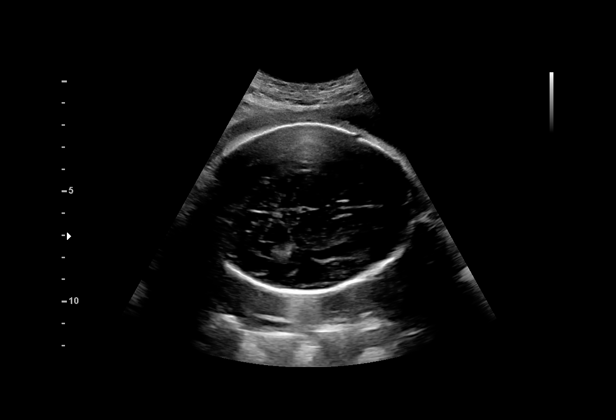
[im 21/41]
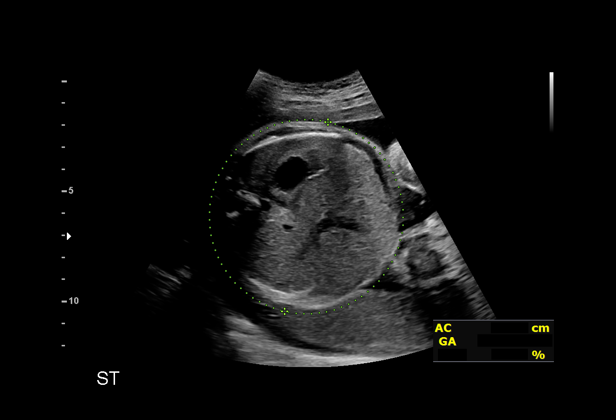
[im 24/41]
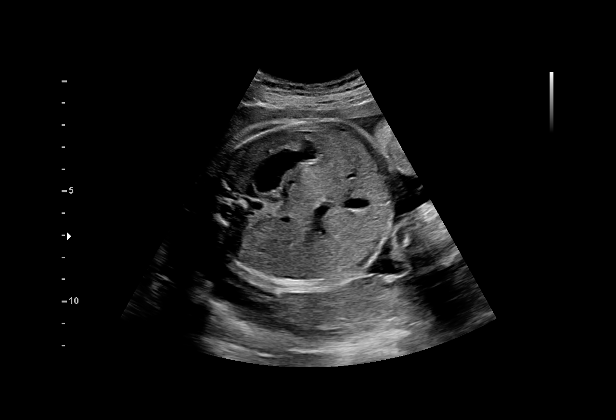
[im 27/41]
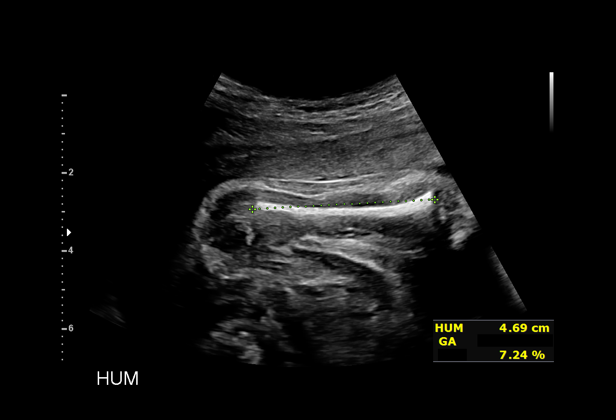
[im 30/41]
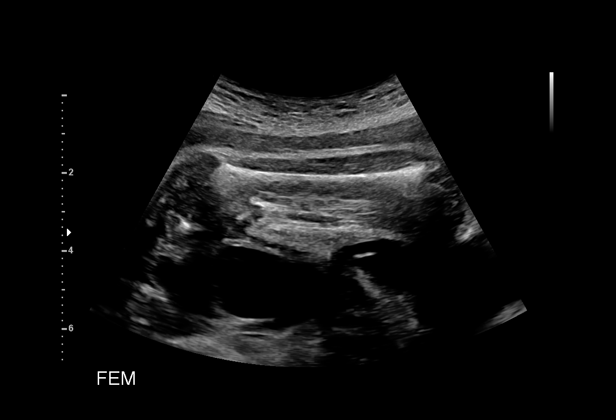
[im 33/41]
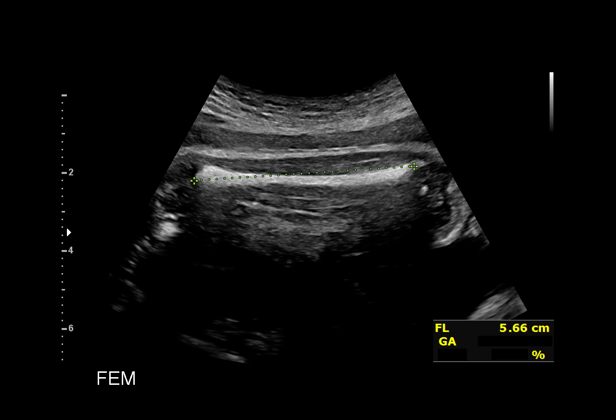
[im 36/41]
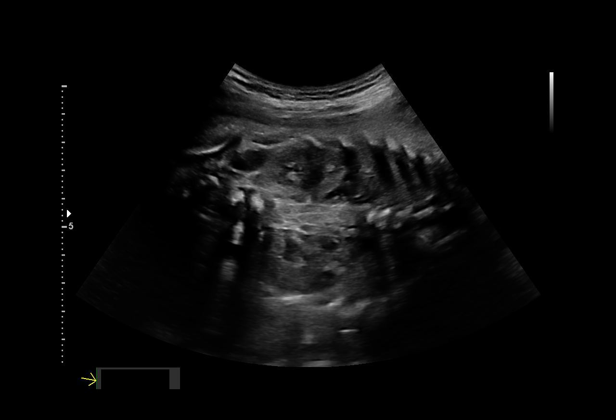
[im 39/41]
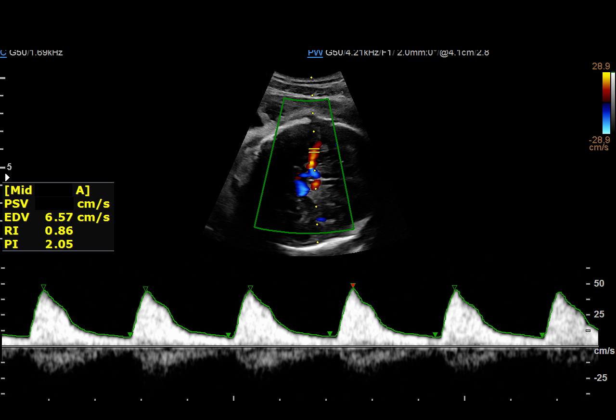

[13 of 28 positions shown; findings below may reference images not displayed]

Attending:        Homar Maese        Ref. Address:     [REDACTED].
                                                            [REDACTED]
 Referred By:      JABIER LARRY

Indications

 Chronic Hepatitis C complicating pregnancy,
 antepartum
 Maternal care for other isoimmunization,
 third trimester, unspecified (Anti D & E)
 Substance abuse affecting pregnancy,
 antepartum
 30 weeks gestation of pregnancy
 Encounter for other antenatal screening
 follow-up
Fetal Evaluation

 Num Of Fetuses:         1
 Preg. Location:         Intrauterine
 Fetal Heart Rate(bpm):  138
 Cardiac Activity:       Observed
 Presentation:           Cephalic
 Placenta:               Posterior

 Amniotic Fluid
 AFI FV:      Within normal limits

 AFI Sum(cm)     %Tile       Largest Pocket(cm)
 18.3            69

 RUQ(cm)       RLQ(cm)       LUQ(cm)        LLQ(cm)
 2.5           4
Biometry

 BPD:      76.4  mm     G. Age:  30w 5d         36  %    CI:        73.84   %    70 - 86
                                                         FL/HC:      19.8   %    19.3 -
 HC:      282.4  mm     G. Age:  31w 0d         21  %    HC/AC:      1.05        0.96 -
 AC:      268.4  mm     G. Age:  31w 0d         53  %    FL/BPD:     73.2   %    71 - 87
 FL:       55.9  mm     G. Age:  29w 3d          9  %    FL/AC:      20.8   %    20 - 24
 HUM:      46.9  mm     G. Age:  27w 4d        < 5  %

 LV:        7.4  mm

 Est. FW:    7927  gm      3 lb 8 oz     29  %
OB History

 Gravidity:    3         Term:   1
 TOP:          1        Living:  1
Gestational Age

 LMP:           31w 5d        Date:  08/08/20                 EDD:   05/15/21
 U/S Today:     30w 4d                                        EDD:   05/23/21
 Best:          30w 5d     Det. By:  Early Ultrasound         EDD:   05/22/21
                                     (10/24/20)
Anatomy

 Cranium:               Appears normal         LVOT:                   Previously seen
 Cavum:                 Appears normal         Aortic Arch:            Previously seen
 Ventricles:            Appears normal         Ductal Arch:            Previously seen
 Choroid Plexus:        Previously seen        Diaphragm:              Previously seen
 Cerebellum:            Previously seen        Stomach:                Appears normal, left
                                                                       sided
 Posterior Fossa:       Previously seen        Abdomen:                Appears normal
 Nuchal Fold:           Previously seen        Abdominal Wall:         Previously seen
 Face:                  Profile appears        Cord Vessels:           Appears normal (3
                        normal                                         vessel cord)
 Lips:                  Previously seen        Kidneys:                Appear normal
 Palate:                Previously seen        Bladder:                Appears normal
 Thoracic:              Previously seen        Spine:                  Previously seen
 Heart:                 Limited Views          Upper Extremities:      Visualized
 RVOT:                  Previously seen        Lower Extremities:      Visualized

 Other:  Heels and 5th digit visualized previously. VC, 3VV and 3VTV
         previously seen. Lenses and nasal bone previously visualized.
         Female gender previously seen.
Doppler - Fetal Vessels

 Middle Cerebral Artery
  S/D                                 PI    %tile     PSV   MoM
                                                    (cm/s)
  6.65                              1.[REDACTED]

Cervix Uterus Adnexa
 Cervix
 Not visualized (advanced GA >14wks)

 Uterus
 No abnormality visualized.

 Right Ovary
 Not visualized.

 Left Ovary
 Not visualized.

 Cul De Sac
 No free fluid seen.

 Adnexa
 No abnormality visualized.
Impression

 Patient returned for fetal growth assessment and MCA
 Doppler studies.  On today's ultrasound, amniotic fluid is
 normal good fetal activity seen.  Fetal growth is appropriate
 for gestational age.  Middle cerebral artery Doppler showed
 normal peak systolic velocity measurements (no evidence of
 fetal anemia).
 [id number]
 Consultation ([REDACTED])

 I [facility] the [facility] She is G3 FE3EE at 30w 5d gestation
 and is here for ultrasound and consultation.
 Ms. Belardinelli is patient of [facility] at [facility]] and has been
 coming to our office for middle-cerebral artery Doppler (MCA)
 studies.
 Patient is still undecided about the place of delivery.  She
 made new OB appointment at [facility] but did
 not follow through.  She lives in [place], [place] [facility]
 Her problems include:
 -Anti-D antibodies ([DATE] titer).
 Anti-E antibodies (too weak to titer).
 -Chronic hepatitis C infection.
 -Methadone maintenance (patient gets her medications from
 a [facility]).
 On routine prenatal screening she had anti-D antibodies that
 been having middle cerebral artery Doppler studies with
 maternal-fetal medicine specialist at [[facility]].  She had
 genetic counseling and [[facility]]
 consultations.
 Patient gives history of IV drug use but reports that she has
 not taken IV drugs for the past 6 months.  She is on
 methadone maintenance and takes 25 mg daily.  She does
 not report any withdrawal symptoms.
 Past medical history is also significant for chronic hepatitis C
 infection with a high viral RNA.  He does not have HIV
 infection.
 She has a diagnosis of depression and takes Zoloft
 prescribed by her physician.  She also reports taking Xanax
 (not prescribed by her physician).
 Past surgical history: Early termination of pregnancy.
 Medications: Prenatal vitamins, methadone, Xanax, Zoloft
 100 mg daily.
 Allergies: Amoxicillin (anaphylaxis), penicillin (rashes).
 Social history: She reports she smokes cigarettes half pack
 per day.  She does not use alcohol or drugs.  Her partner was
 Caucasian but, unfortunately, died of overdose in July 2020.
 Family history: No history of venous thromboembolism in the
 family.
 Obstetric history significant for a term vaginal delivery 7096 of
 a male infant weighing 5 pounds 13 ounces at birth.  Her son
 is in good health.  In December 2018 when she had an early
 termination of pregnancy without complications.
 GYN history: Her previous cycles were reportedly regular.
 She does not give history of abnormal Pap smears or cervical
 surgeries.  No history of breast disease.
 Prenatal course: She had opted not to screen for fetal
 aneuploidies.  Midtrimester fetal anatomy scan was reported
 as normal.  Her pregnancy is well dated by 10-week
 ultrasound (10/24/20).

 Labs (October 2020)
 Hemoglobin 12.8, hematocrit 38, WBC 8.3, platelets 429, Hb
 AA, HBsAg neg, HIV nonreactive, rubella immune. HCV RNA
 354,000 NISSIM/mL.
 12/16/2020: Fentanyl/nor fentanyl positive.
 Our concerns include
 Anti-D antibodies
 -I reassured her that the titers are low and had not reached a
 critical level to cause fetal anemia.
 -Given that she is having regular MCA Doppler studies,
 repeat titers are not necessary.
 -Although anti-D antibodies are too low to cause fetal
 hemolysis, frequent surveillance is necessary to detect fetal
 anemia.
 -If MCA Doppler studies show peak systolic velocity
 measurement greater than 1.5 MoM, fetal blood sampling is
 indicated.  And if fetal anemia is confirmed, intrauterine blood
 transfusion would be recommended.
 -In the absence of fetal anemia, the pregnancy can proceed
 to 8080 weeks.  I have recommend delivery at 39 weeks
 gestation.

 Hepatitis C infection in pregnancy
 In the absence of co-infection with HIV, Mother-to-Child-
 Transmission is low and is around 5% regardless of mode of
 delivery. Patient understands that this is a chronic disease.
 Provided the liver functions are normal, it is unlikely to
 adversely affect the pregnancy outcome. Presence of high
 viral RNA can be associated with increased perinatal
 transmission and some have advocated cesarean delivery if
 high viral RNA is present. However, the current
 recommendation is that the patient should be encouraged to
 have vaginal delivery. Breastfeeding is not contraindicated.

 Direct-acting antivirals (Ariko) has been studied in pregnancy
 (Phase I trial). Trial using ledipasvir-sofosbuvir (LJAJIC/LOCKLEAR) in
 9 pregnant women starting in the second trimester (23-24
 weeks) showed all patients were cured of HCV with no
 perinatal transmission.
 Currently, hepatitis C is not treated with antivirals in
 pregnancy.

 Methadone maintenance
 Methadone is one of the first drugs used in treating opioid
 addiction in pregnancy. Methadone treatment (compared to
 untreated heroin use) has been shown to improve pregnancy
 outcomes including increased fetal growth (and birth
 weights), decreased fetal mortality, decreased risk of
 preeclampsia and HIV infections, and increased likelihood of
 the neonate being discharged sooner. Concomitant
 substance use adversely affects the pregnancy outcomes.
 Neonatal abstinence syndrome (AWA) can occur in 50% to
 80% of women. No increased adverse effects are seen with
 breastfeeding.
 Prenatal care
 Patient is still undecided about the place of delivery.  At her
 initial visit with us she had clearly indicated that she would
 like to deliver at [facility]].
 She made an appointment to see an obstetrician on
 03/04/2021 but did not show for the appointment.
 I called [facility] where she initiated her prenatal care and
 spoke to Ms. Willker Armelin, the nurse.  I learned that they
 have been trying to contact this patient was not had a
 prenatal visit since December 2020.  They had received my first
 ultrasound report and I will email other reports to Ms. Romanio.
 I had strongly emphasized the importance of initiating
 prenatal care at [HOSPITAL].  Choice of the place of delivery
 with good NICU facilities is important.
Recommendations

 -An appointment was made for her to return in 2 weeks for
 BPP and MCA Doppler studies, and then weekly BPP and
 MCA till delivery.
 -Induction of labor may be considered at 39 weeks gestation.
 Early term delivery may be advised based on antenatal
 testing.
 -Continue methadone.
 -Patient to initiate her prenatal care at [facility]
 [facility], [facility]].
                 [place], [place]
